# Patient Record
Sex: Female | Born: 1955 | Race: White | Hispanic: No | Marital: Single | Smoking: Never smoker
Health system: Southern US, Community
[De-identification: ages and names within clinical notes are randomized; demographics above are authoritative.]

## PROBLEM LIST (undated history)

## (undated) DIAGNOSIS — I10 Essential (primary) hypertension: Secondary | ICD-10-CM

## (undated) HISTORY — DX: Essential (primary) hypertension: I10

---

## 2005-02-23 HISTORY — PX: BREAST BIOPSY: SHX20

## 2015-10-19 NOTE — Progress Notes (Signed)
Cardiology Office Note   Date:  10/22/2015   ID:  Heather Russell, DOB 1955-06-13, MRN [id number]  Referring Doctor:  No PCP Per Patient   Cardiologist:   Wende Bushy, MD   Reason for consultation:  Chief Complaint  Patient presents with  . Establish Care    family hx of cardiac disease had stress test at [facility]      History of Present Illness: Heather Russell is a 60 y.o. female who presents for Establishing cardiology care due to family history of premature CAD.  Patient does not report any symptoms of chest pain, chest tightness, shortness of breath, palpitations, lightheadedness, syncope. No exertional symptoms. Growing up she has been very active in sports. She tried to take good care of herself. In the last 5 years or so, she had been unable to keep up with her physical activity due to stresses at work, and also the need to take care of her mother.  She has a strong family history of premature CAD: Both paternal grandparents had heart disease in their 26s and 75s. Father presented with CAD and open heart surgery at age 38. Brother had CAD/cardiac arrest at age 37.  ROS:  Please see the history of present illness. Aside from mentioned under HPI, all other systems are reviewed and negative.     Past Medical History:  Diagnosis Date  . Hypertension     History reviewed. No pertinent surgical history.   reports that she has never smoked. She has never used smokeless tobacco. She reports that she drinks about 3.6 - 5.4 oz of alcohol per week . She reports that she does not use drugs.   family history includes Heart attack (age of onset: 67) in her father; Heart disease in her brother and father; Hyperlipidemia in her father.   No outpatient prescriptions prior to visit.   No facility-administered medications prior to visit.      Allergies: Review of patient's allergies indicates no known allergies.    PHYSICAL EXAM: VS:  BP 120/80 (BP Location: Left Arm,  Cuff Size: Normal)   Pulse 72   Ht _0  (1.6 m)   Wt 148 lb 12.8 oz (67.5 kg)   SpO2 95%   BMI 26.36 kg/m  , Body mass index is 26.36 kg/m. Wt Readings from Last 3 Encounters:  10/22/15 148 lb 12.8 oz (67.5 kg)    GENERAL:  well developed, well nourished, not in acute distress HEENT: normocephalic, pink conjunctivae, anicteric sclerae, no xanthelasma, normal dentition, oropharynx clear NECK:  no neck vein engorgement, JVP normal, no hepatojugular reflux, carotid upstroke brisk and symmetric, no bruit, no thyromegaly, no lymphadenopathy LUNGS:  good respiratory effort, clear to auscultation bilaterally CV:  PMI not displaced, no thrills, no lifts, S1 and S2 within normal limits, no palpable S3 or S4, no murmurs, no rubs, no gallops ABD:  Soft, nontender, nondistended, normoactive bowel sounds, no abdominal aortic bruit, no hepatomegaly, no splenomegaly MS: nontender back, no kyphosis, no scoliosis, no joint deformities EXT:  2+ DP/PT pulses, no edema, no varicosities, no cyanosis, no clubbing SKIN: warm, nondiaphoretic, normal turgor, no ulcers NEUROPSYCH: alert, oriented to person, place, and time, sensory/motor grossly intact, normal mood, appropriate affect  Recent Labs: No results found for requested labs within last 8760 hours.   Lipid Panel No results found for: CHOL, TRIG, HDL, CHOLHDL, VLDL, LDLCALC, LDLDIRECT   Other studies Reviewed:  EKG:  The ekg from 10/22/2015 was personally reviewed by me and it revealed  sinus rhythm, 66 BPM. Low-voltage QRS.  Additional studies/ records that were reviewed personally reviewed by me today include:  Stress echo 04/19/2012, [facility]: 12.32 minutes. Maximum heart rate was 164. Target heart rate 140. 15.6 Mets. Normal stress test.   ASSESSMENT AND PLAN:  Hypertension BP is well controlled. Continue monitoring BP. Continue current medical therapy and lifestyle  Changes. Recommend aspirin 81 mg by mouth daily. EKG is borderline to  abnormal with low voltages - recommend echocardiogram.  Family history of premature CAD Recommend further risk stratification with some blood work: CBC, CMP, fasting lipid panel. If her lipids are controlled and there is no indication for statin therapy, she may consider doing calcium scoring for further risk stratification. Patient verbalized understanding and agreed with plan.  Current medicines are reviewed at length with the patient today.  The patient does not have concerns regarding medicines.  Labs/ tests ordered today include:  Orders Placed This Encounter  Procedures  . CBC with Differential/Platelet  . Comp Met (CMET)  . Lipid Profile  . EKG 12-Lead  . ECHOCARDIOGRAM COMPLETE    I had a lengthy and detailed discussion with the patient regarding diagnoses, prognosis, diagnostic options, treatment options , and Russell effects of medications.   I counseled the patient on importance of lifestyle modification including heart healthy diet, regular physical activity.  I spent at least 45 minutes with the patient today and more than 50% of the time was spent counseling the patient and coordinating care.     Disposition:   FU with undersigned after tests    Signed, Wende Bushy, MD  10/22/2015 9:39 AM    Heather Russell  This note was generated in part with voice recognition software and I apologize for any typographical errors that were not detected and corrected.

## 2015-10-22 ENCOUNTER — Encounter (INDEPENDENT_AMBULATORY_CARE_PROVIDER_SITE_OTHER): Payer: Self-pay

## 2015-10-22 ENCOUNTER — Encounter: Payer: Self-pay | Admitting: Cardiology

## 2015-10-22 ENCOUNTER — Ambulatory Visit (INDEPENDENT_AMBULATORY_CARE_PROVIDER_SITE_OTHER): Payer: BLUE CROSS/BLUE SHIELD | Admitting: Cardiology

## 2015-10-22 VITALS — BP 120/80 | HR 72 | Ht 63.0 in | Wt 148.8 lb

## 2015-10-22 DIAGNOSIS — Z8249 Family history of ischemic heart disease and other diseases of the circulatory system: Secondary | ICD-10-CM | POA: Diagnosis not present

## 2015-10-22 DIAGNOSIS — I10 Essential (primary) hypertension: Secondary | ICD-10-CM | POA: Diagnosis not present

## 2015-10-22 DIAGNOSIS — R9431 Abnormal electrocardiogram [ECG] [EKG]: Secondary | ICD-10-CM | POA: Diagnosis not present

## 2015-10-22 NOTE — Patient Instructions (Addendum)
Labwork: Your physician recommends that you return for lab work: CBC, CMP, and fasting lipid panel. Please make sure not to eat or drink the night prior to having these labs done.   Testing/Procedures: Your physician has requested that you have an echocardiogram. Echocardiography is a painless test that uses sound waves to create images of your heart. It provides your doctor with information about the size and shape of your heart and how well your heart's chambers and valves are working. This procedure takes approximately one hour. There are no restrictions for this procedure.    Follow-Up: Your physician recommends that you schedule a follow-up appointment as needed. We will call you with results and if needed schedule follow up at that time.  Try calling [facility] for a primary care provider. Their number is [phone] and address is as follows, [address], [place] [address]  It was a pleasure seeing you today here in the office. Please do not hesitate to give [place] a call back if you have any further questions. [phone]   Cellar RN, BSN    Echocardiogram An echocardiogram, or echocardiography, uses sound waves (ultrasound) to produce an image of your heart. The echocardiogram is simple, painless, obtained within a short period of time, and offers valuable information to your health care provider. The images from an echocardiogram can provide information such as:  Evidence of coronary artery disease (CAD).  Heart size.  Heart muscle function.  Heart valve function.  Aneurysm detection.  Evidence of a past heart attack.  Fluid buildup around the heart.  Heart muscle thickening.  Assess heart valve function. LET [facility] CARE PROVIDER KNOW ABOUT:  Any allergies you have.  All medicines you are taking, including vitamins, herbs, eye drops, creams, and over-the-counter medicines.  Previous problems you or members  of your family have had with the use of anesthetics.  Any blood disorders you have.  Previous surgeries you have had.  Medical conditions you have.  Possibility of pregnancy, if this applies. BEFORE THE PROCEDURE  No special preparation is needed. Eat and drink normally.  PROCEDURE   In order to produce an image of your heart, gel will be applied to your chest and a wand-like tool (transducer) will be moved over your chest. The gel will help transmit the sound waves from the transducer. The sound waves will harmlessly bounce off your heart to allow the heart images to be captured in real-time motion. These images will then be recorded.  You may need an IV to receive a medicine that improves the quality of the pictures. AFTER THE PROCEDURE You may return to your normal schedule including diet, activities, and medicines, unless your health care provider tells you otherwise.   This information is not intended to replace advice given to you by your health care provider. Make sure you discuss any questions you have with your health care provider.   Document Released: 02/07/2000 Document Revised: 03/02/2014 Document Reviewed: 10/17/2012 Elsevier Interactive Patient Education 2016 [employer].   Lipid Profile Test WHY AM I HAVING THIS TEST? The lipid profile test gives results that can help predict the likelihood of developing heart disease. The test is also used to monitor treatment for high cholesterol to see if you are reaching your goals. A lipid profile measures the following:  Total cholesterol. Cholesterol is a waxy fat in your blood. If your total cholesterol is elevated, this can increase your risk of coronary heart disease.  High-density lipoprotein (HDL). This is known as the good cholesterol. Having a high level of HDL is good. Your HDL level may be low if you smoke or do not get enough exercise.  Low-density lipoprotein (LDL). This is known as the bad cholesterol and is  responsible for the formation of plaque in the arteries. Having a low level of LDL is best.  Cholesterol to HDL ratio. This is calculated by dividing the total cholesterol by the HDL cholesterol. The ratio is used by health care providers for determining your risk of heart disease. A low ratio is best.  Triglycerides. These are a type of fat in the blood responsible for providing energy to your cells. Low levels are best. WHAT KIND OF SAMPLE IS TAKEN? A blood sample is required for this test. It is usually collected by inserting a needle into a vein. HOW DO I PREPARE FOR THE TEST?  Do not eat or drink anything after midnight on the night before the test or as directed by your health care provider. WHAT ARE THE REFERENCE RANGES? Reference ranges are considered healthy ranges established after testing a large group of healthy people. Reference ranges may vary among different people, labs, and hospitals. It is your responsibility to obtain your test results. Ask the lab or department performing the test when and how you will get your results. Reference ranges for the lipid profile test are as follows: Total Cholesterol  Adult or elderly: less than 200 mg/dL or less than 1.615.20 mmol/L (SI units).  Child: 120-200 mg/dL.  Infant: 70-175 mg/dL.  Newborns: 53-135 mg/dL. HDL  Female: greater than 45 mg/dL or greater than 0.960.75 mmol/L (SI units).  Female: greater than 55 mg/dL or greater than 0.450.91 mmol/L (SI units). HDL reference values based on risk of heart disease:  For low risk of heart disease:  Female: 60 mg/dL or 4.091.55 mmol/L.  Female: 70 mg/dL or 8.111.81 mmol/L.  For moderate risk of heart disease:  Female: 45 mg/dL or 9.141.17 mmol/L.  Female: 55 mg/dL or 7.821.42 mmol/L.  For high risk of heart disease:  Female: 25 mg/dL or 9.560.65 mmol/L.  Female: 35 mg/dL or 2.130.90 mmol/L. LDL  Adult: less than 130 mg/dL.  Children: less than 110 mg/dL. Cholesterol to HDL Ratio Reference values based on risk  for coronary heart disease:  Risk that is one half average:  Female: 3.4.  Female: 3.3.  Average risk:  Female: 5.0.  Female: 4.4.  Risk that is two times average (moderate risk):  Female: 10.0.  Female: 7.0.  Risk that is three times average (high risk):  Female: 24.0.  Female: 11.0. Triglycerides  Adult or elderly:  Female: 40-160 mg/dL or 0.86-5.780.45-1.81 mmol/L (SI units).  Female: 35-135 mg/dL or 4.69-6.290.40-1.52 mmol/L (SI units).  Children 630-58 years old:  Female: 30-86 mg/dL.  Female: 32-99 mg/dL.  Children 726-58 years old:  Female: 31-108 mg/dL.  Female: 35-114 mg/dL.  Children 5212-19177 years old:  Female: 36-138 mg/dL.  Female: 41-138 mg/dL.  Children 5816-58 years old:  Female: 40-163 mg/dL.  Female: 40-128 mg/dL. Triglycerides should be less than 400 mg/dL even when you are not fasting.  WHAT DO THE RESULTS MEAN?  Talk with your health care provider to discuss your results, treatment options, and if necessary, the need for more tests. Talk with your health care provider if you have any questions about your results.   This information is not intended to replace advice given to you by your health care provider. Make sure you  discuss any questions you have with your health care provider.   Document Released: 03/05/2004 Document Revised: 03/02/2014 Document Reviewed: 06/01/2013 Elsevier Interactive Patient Education [employer]

## 2015-10-29 ENCOUNTER — Other Ambulatory Visit: Payer: BLUE CROSS/BLUE SHIELD

## 2015-10-29 DIAGNOSIS — R9431 Abnormal electrocardiogram [ECG] [EKG]: Secondary | ICD-10-CM

## 2015-10-29 DIAGNOSIS — I10 Essential (primary) hypertension: Secondary | ICD-10-CM

## 2015-10-30 LAB — COMPREHENSIVE METABOLIC PANEL
ALT: 25 IU/L (ref 0–32)
AST: 17 IU/L (ref 0–40)
Albumin/Globulin Ratio: 2.3 — ABNORMAL HIGH (ref 1.2–2.2)
Albumin: 4.5 g/dL (ref 3.5–5.5)
Alkaline Phosphatase: 63 IU/L (ref 39–117)
BUN/Creatinine Ratio: 21 (ref 9–23)
BUN: 15 mg/dL (ref 6–24)
Bilirubin Total: 0.4 mg/dL (ref 0.0–1.2)
CO2: 23 mmol/L (ref 18–29)
Calcium: 9.3 mg/dL (ref 8.7–10.2)
Chloride: 99 mmol/L (ref 96–106)
Creatinine, Ser: 0.73 mg/dL (ref 0.57–1.00)
GFR calc Af Amer: 105 mL/min/{1.73_m2} (ref >59)
GFR calc non Af Amer: 91 mL/min/{1.73_m2} (ref >59)
Globulin, Total: 2 g/dL (ref 1.5–4.5)
Glucose: 95 mg/dL (ref 65–99)
Potassium: 4.2 mmol/L (ref 3.5–5.2)
Sodium: 140 mmol/L (ref 134–144)
Total Protein: 6.5 g/dL (ref 6.0–8.5)

## 2015-10-30 LAB — CBC WITH DIFFERENTIAL/PLATELET
Basophils Absolute: 0.1 10*3/uL (ref 0.0–0.2)
Basos: 1 %
EOS (ABSOLUTE): 0.2 10*3/uL (ref 0.0–0.4)
Eos: 3 %
Hematocrit: 43.6 % (ref 34.0–46.6)
Hemoglobin: 14.8 g/dL (ref 11.1–15.9)
Immature Grans (Abs): 0 10*3/uL (ref 0.0–0.1)
Immature Granulocytes: 0 %
Lymphocytes Absolute: 1.2 10*3/uL (ref 0.7–3.1)
Lymphs: 22 %
MCH: 30.3 pg (ref 26.6–33.0)
MCHC: 33.9 g/dL (ref 31.5–35.7)
MCV: 89 fL (ref 79–97)
Monocytes Absolute: 0.3 10*3/uL (ref 0.1–0.9)
Monocytes: 6 %
Neutrophils Absolute: 3.6 10*3/uL (ref 1.4–7.0)
Neutrophils: 68 %
Platelets: 257 10*3/uL (ref 150–379)
RBC: 4.88 x10E6/uL (ref 3.77–5.28)
RDW: 13.5 % (ref 12.3–15.4)
WBC: 5.3 10*3/uL (ref 3.4–10.8)

## 2015-10-30 LAB — LIPID PANEL
Chol/HDL Ratio: 3.4 ratio units (ref 0.0–4.4)
Cholesterol, Total: 215 mg/dL — ABNORMAL HIGH (ref 100–199)
HDL: 64 mg/dL (ref >39)
LDL Calculated: 114 mg/dL — ABNORMAL HIGH (ref 0–99)
Triglycerides: 184 mg/dL — ABNORMAL HIGH (ref 0–149)
VLDL Cholesterol Cal: 37 mg/dL (ref 5–40)

## 2015-11-07 ENCOUNTER — Telehealth: Payer: Self-pay | Admitting: *Deleted

## 2015-11-07 ENCOUNTER — Other Ambulatory Visit: Payer: Self-pay

## 2015-11-07 ENCOUNTER — Ambulatory Visit (INDEPENDENT_AMBULATORY_CARE_PROVIDER_SITE_OTHER): Payer: BLUE CROSS/BLUE SHIELD

## 2015-11-07 DIAGNOSIS — R9431 Abnormal electrocardiogram [ECG] [EKG]: Secondary | ICD-10-CM

## 2015-11-07 DIAGNOSIS — I10 Essential (primary) hypertension: Secondary | ICD-10-CM | POA: Diagnosis not present

## 2015-11-07 NOTE — Telephone Encounter (Signed)
Spoke with patient and she stated that she has not had symptoms with activity before and so she wanted to mention today while she was here. Let her know that Dr. Alvino ChapelIngal would like for her to come in and discuss these new symptoms with her. She verbalized agreement with plan of care and had no further questions at this time. Scheduled to come in 11/12/15 to see Dr. Alvino ChapelIngal.

## 2015-11-07 NOTE — Telephone Encounter (Signed)
-----   Message from Almond LintAileen Ingal, MD sent at 11/07/2015  9:09 AM EDT ----- Regarding: RE: Patient symptoms Based on office note she was not symptomatic at the time and therefore no ischemia eval was recommended. . This sounds new and she may need to come back in to discuss it with us. Thank you for that update. Pam, can we call her about this? Thank you! ----- Message ----- From: Edwena BlowGary T Joseph Sent: 11/07/2015   9:01 AM To: Almond LintAileen Ingal, MD, Bryna ColanderPamela S Roshan Roback, RN Subject: Patient symptoms                               FYI.Marland Kitchen.Marland Kitchen.patient's echo was just completed and it looked benign to me however, she reported that 2 weeks ago she had chest heaviness on a hiking trip.

## 2015-11-11 NOTE — Progress Notes (Signed)
Cardiology Office Note   Date:  11/12/2015   ID:  Heather Russell, DOB 1955/11/22, MRN [id number]  Referring Doctor:  No PCP Per Patient   Cardiologist:   Wende Bushy, MD   Reason for consultation:  Chief Complaint  Patient presents with  . other    F/u echo and labs no complaints today is feeling well. Meds reviewed verbally with pt.      History of Present Illness: Heather Russell is a 60 y.o. female who presents for Follow-up  She underwent echocardiogram recently. Her initial consultation with cardiology was in mainly for risk assessment for her strong family history of premature CAD.  She has a strong family history of premature CAD: Both paternal grandparents had heart disease in their 45s and 84s. Father presented with CAD and open heart surgery at age 54. Brother had CAD/cardiac arrest at age 69.  She went hiking recently. She had not done that for a long time. It was a 3-1/2 mile hike. She did develop new onset chest pain. This was described as a tightness or heaviness in the center of the chest, mild in intensity, nonradiating, lasting a few minutes, resolved with end of activity. No associated nausea, vomiting, diaphoresis.  ROS:  Please see the history of present illness. Aside from mentioned under HPI, all other systems are reviewed and negative.     Past Medical History:  Diagnosis Date  . Hypertension     History reviewed. No pertinent surgical history.   reports that she has never smoked. She has never used smokeless tobacco. She reports that she drinks about 3.6 - 5.4 oz of alcohol per week . She reports that she does not use drugs.   family history includes Heart attack (age of onset: 34) in her father; Heart disease in her brother and father; Hyperlipidemia in her father.   Outpatient Medications Prior to Visit  Medication Sig Dispense Refill  . Cholecalciferol (VITAMIN D3) 5000 units CAPS Take 1 capsule by mouth daily.    . Multiple  Vitamin (MULTI-VITAMINS) TABS Take 1 tablet by mouth daily.    . irbesartan-hydrochlorothiazide (AVALIDE) 150-12.5 MG tablet Take 1 tablet by mouth daily.     No facility-administered medications prior to visit.      Allergies: Other    PHYSICAL EXAM: VS:  BP 120/90 (BP Location: Left Arm, Patient Position: Sitting, Cuff Size: Normal)   Pulse 65   Ht '5\' 3"'$  (1.6 m)   Wt 148 lb (67.1 kg)   BMI 26.22 kg/m  , Body mass index is 26.22 kg/m. Wt Readings from Last 3 Encounters:  11/12/15 148 lb (67.1 kg)  10/22/15 148 lb 12.8 oz (67.5 kg)    GENERAL:  well developed, well nourished, not in acute distress HEENT: normocephalic, pink conjunctivae, anicteric sclerae, no xanthelasma, normal dentition, oropharynx clear NECK:  no neck vein engorgement, JVP normal, no hepatojugular reflux, carotid upstroke brisk and symmetric, no bruit, no thyromegaly, no lymphadenopathy LUNGS:  good respiratory effort, clear to auscultation bilaterally CV:  PMI not displaced, no thrills, no lifts, S1 and S2 within normal limits, no palpable S3 or S4, no murmurs, no rubs, no gallops ABD:  Soft, nontender, nondistended, normoactive bowel sounds, no abdominal aortic bruit, no hepatomegaly, no splenomegaly MS: nontender back, no kyphosis, no scoliosis, no joint deformities EXT:  2+ DP/PT pulses, no edema, no varicosities, no cyanosis, no clubbing SKIN: warm, nondiaphoretic, normal turgor, no ulcers NEUROPSYCH: alert, oriented to person, place, and time, sensory/motor grossly intact,  normal mood, appropriate affect  Recent Labs: 10/29/2015: ALT 25; BUN 15; Creatinine, Ser 0.73; Platelets 257; Potassium 4.2; Sodium 140   Lipid Panel    Component Value Date/Time   CHOL 215 (H) 10/29/2015 0811   TRIG 184 (H) 10/29/2015 0811   HDL 64 10/29/2015 0811   CHOLHDL 3.4 10/29/2015 0811   LDLCALC 114 (H) 10/29/2015 0811     Other studies Reviewed:  EKG:  The ekg from 10/22/2015 was personally reviewed by me and it  revealed sinus rhythm, 66 BPM. Low-voltage QRS.  Additional studies/ records that were reviewed personally reviewed by me today include:  Stress echo 04/19/2012, [facility]: 12.32 minutes. Maximum heart rate was 164. Target heart rate 140. 15.6 Mets. Normal stress test.  Echo 11/07/2015: Left ventricle: The cavity size was normal. Wall thickness was   normal. Systolic function was normal. The estimated ejection   fraction was in the range of 55% to 60%. Wall motion was normal;   there were no regional wall motion abnormalities. Doppler   parameters are consistent with abnormal left ventricular   relaxation (grade 1 diastolic dysfunction).   ASSESSMENT AND PLAN:  Chest pain With her strong family history of CAD, recommend further evaluation with a nuclear exercise stress test. Recommend a clear imaging, since patient had stress echo within the last 5 years. Imaging is recommended to increase the sensitivity of testing.  Hypertension BP is well controlled. Continue monitoring BP. Continue current medical therapy and lifestyle  Changes. Recommend aspirin 81 mg by mouth daily. Echocardiogram showed normal LV ejection fraction.  Family history of premature CAD LDL mildly elevated. Discussed options of treatment. Patient would like to initiate lifestyle changes including dietary modification and increasing physical activity once cardiac workup is completed. Recommend repeat blood work lipid panel and CMP in 3 months time. Patient can follow-up with her PCP. If the stress test is negative,, she can still consider doing calcium scoring for further risk stratification. Patient verbalized understanding and agreed with plan.  Current medicines are reviewed at length with the patient today.  The patient does not have concerns regarding medicines.  Labs/ tests ordered today include:  Orders Placed This Encounter  Procedures  . NM Myocar Multi W/Spect W/Wall Motion / EF  . Comp Met (CMET)  . Lipid  Profile  . EKG 12-Lead    I had a lengthy and detailed discussion with the patient regarding diagnoses, prognosis, diagnostic options, treatment options , and side effects of medications.   I counseled the patient on importance of lifestyle modification including heart healthy diet, regular physical activity.Once cardiac workup is completed  Disposition:   FU with undersigned after tests    Signed, Wende Bushy, MD  11/12/2015 10:30 AM    [place]  This note was generated in part with voice recognition software and I apologize for any typographical errors that were not detected and corrected.

## 2015-11-12 ENCOUNTER — Ambulatory Visit (INDEPENDENT_AMBULATORY_CARE_PROVIDER_SITE_OTHER): Payer: BLUE CROSS/BLUE SHIELD | Admitting: Cardiology

## 2015-11-12 ENCOUNTER — Encounter: Payer: Self-pay | Admitting: Cardiology

## 2015-11-12 VITALS — BP 120/90 | HR 65 | Ht 63.0 in | Wt 148.0 lb

## 2015-11-12 DIAGNOSIS — Z8249 Family history of ischemic heart disease and other diseases of the circulatory system: Secondary | ICD-10-CM | POA: Diagnosis not present

## 2015-11-12 DIAGNOSIS — R079 Chest pain, unspecified: Secondary | ICD-10-CM | POA: Diagnosis not present

## 2015-11-12 DIAGNOSIS — I10 Essential (primary) hypertension: Secondary | ICD-10-CM

## 2015-11-12 MED ORDER — IRBESARTAN-HYDROCHLOROTHIAZIDE 150-12.5 MG PO TABS: 1.0000 | ORAL_TABLET | Freq: Every day | ORAL | 6 refills | Status: DC

## 2015-11-12 NOTE — Patient Instructions (Addendum)
Medication Instructions:  Your physician has recommended you make the following change in your medication:  1. Refill sent in for Avalide  Labwork: Your physician recommends that you return for lab work in: 3 months for fasting lipid panel and CMP. Make sure to not eat or drink after midnight prior to having these tests done.    Testing/Procedures: [place]  Your caregiver has ordered a Stress Test with nuclear imaging. The purpose of this test is to evaluate the blood supply to your heart muscle. This procedure is referred to as a "Non-Invasive Stress Test." This is because other than having an IV started in your vein, nothing is inserted or "invades" your body. Cardiac stress tests are done to find areas of poor blood flow to the heart by determining the extent of coronary artery disease (CAD).    Please note: these test may take anywhere between 2-4 hours to complete  PLEASE REPORT TO [facility] AT THE FIRST DESK WILL DIRECT YOU WHERE TO GO  Date of Procedure:_Tuesday November 26, 2015 at 07:30AM_  Arrival Time for Procedure:__Arrive at 07:15AM to register___   PLEASE NOTIFY THE OFFICE AT LEAST 24 HOURS IN ADVANCE IF YOU ARE UNABLE TO [place].  [phone] AND  PLEASE NOTIFY NUCLEAR MEDICINE AT [facility] AT LEAST 24 HOURS IN ADVANCE IF YOU ARE UNABLE TO KEEP YOUR APPOINTMENT. [phone]  How to prepare for your Myoview test:  1. Do not eat or drink after midnight 2. No caffeine for 24 hours prior to test 3. No smoking 24 hours prior to test. 4. Your medication may be taken with water.  If your doctor stopped a medication because of this test, do not take that medication. 5. Ladies, please do not wear dresses.  Skirts or pants are appropriate. Please wear a short sleeve shirt. 6. No perfume, cologne or lotion. 7. Wear comfortable walking shoes. No heels!  Follow-Up: Your physician recommends that you schedule a follow-up  appointment as needed. We will call you with results and if needed schedule follow up at that time.  It was a pleasure seeing you today here in the office. Please do not hesitate to give [place] a call back if you have any further questions. [place], BSN      Please establish care with a primary care physician.      Cardiac Nuclear Scanning A cardiac nuclear scan is used to check your heart for problems, such as the following:  A portion of the heart is not getting enough blood.  Part of the heart muscle has died, which happens with a heart attack.  The heart wall is not working normally.  In this test, a radioactive dye (tracer) is injected into your bloodstream. After the tracer has traveled to your heart, a scanning device is used to measure how much of the tracer is absorbed by or distributed to various areas of your heart. LET [facility] CARE PROVIDER KNOW ABOUT:  Any allergies you have.  All medicines you are taking, including vitamins, herbs, eye drops, creams, and over-the-counter medicines.  Previous problems you or members of your family have had with the use of anesthetics.  Any blood disorders you have.  Previous surgeries you have had.  Medical conditions you have.  RISKS AND COMPLICATIONS Generally, this is a safe procedure. However, as with any procedure, problems can occur. Possible problems include:   Serious chest pain.  Rapid heartbeat.  Sensation of  warmth in your chest. This usually passes quickly. BEFORE THE PROCEDURE Ask your health care provider about changing or stopping your regular medicines. PROCEDURE This procedure is usually done at a hospital and takes 2-4 hours.  An IV tube is inserted into one of your veins.  Your health care provider will inject a small amount of radioactive tracer through the tube.  You will then wait for 20-40 minutes while the tracer travels through your bloodstream.  You will lie down on an  exam table so images of your heart can be taken. Images will be taken for about 15-20 minutes.  You will exercise on a treadmill or stationary bike. While you exercise, your heart activity will be monitored with an electrocardiogram (ECG), and your blood pressure will be checked.  If you are unable to exercise, you may be given a medicine to make your heart beat faster.  When blood flow to your heart has peaked, tracer will again be injected through the IV tube.  After 20-40 minutes, you will get back on the exam table and have more images taken of your heart.  When the procedure is over, your IV tube will be removed. AFTER THE PROCEDURE  You will likely be able to leave shortly after the test. Unless your health care provider tells you otherwise, you may return to your normal schedule, including diet, activities, and medicines.  Make sure you find out how and when you will get your test results.   This information is not intended to replace advice given to you by your health care provider. Make sure you discuss any questions you have with your health care provider.   Document Released: 03/06/2004 Document Revised: 02/14/2013 Document Reviewed: 01/18/2013 Elsevier Interactive Patient Education 2016 [place] Profile Test WHY AM I HAVING THIS TEST? The lipid profile test gives results that can help predict the likelihood of developing heart disease. The test is also used to monitor treatment for high cholesterol to see if you are reaching your goals. A lipid profile measures the following:  Total cholesterol. Cholesterol is a waxy fat in your blood. If your total cholesterol is elevated, this can increase your risk of coronary heart disease.  High-density lipoprotein (HDL). This is known as the good cholesterol. Having a high level of HDL is good. Your HDL level may be low if you smoke or do not get enough exercise.  Low-density lipoprotein (LDL). This is known as the  bad cholesterol and is responsible for the formation of plaque in the arteries. Having a low level of LDL is best.  Cholesterol to HDL ratio. This is calculated by dividing the total cholesterol by the HDL cholesterol. The ratio is used by health care providers for determining your risk of heart disease. A low ratio is best.  Triglycerides. These are a type of fat in the blood responsible for providing energy to your cells. Low levels are best. WHAT KIND OF SAMPLE IS TAKEN? A blood sample is required for this test. It is usually collected by inserting a needle into a vein. HOW DO I PREPARE FOR THE TEST?  Do not eat or drink anything after midnight on the night before the test or as directed by your health care provider. WHAT ARE THE REFERENCE RANGES? Reference ranges are considered healthy ranges established after testing a large group of healthy people. Reference ranges may vary among different people, labs, and hospitals. It is your responsibility to obtain your test  results. Ask the lab or department performing the test when and how you will get your results. Reference ranges for the lipid profile test are as follows: Total Cholesterol  Adult or elderly: less than 200 mg/dL or less than 5.20 mmol/L (SI units).  Child: 120-200 mg/dL.  Infant: 70-175 mg/dL.  Newborns: 53-135 mg/dL. HDL  Female: greater than 45 mg/dL or greater than 0.75 mmol/L (SI units).  Female: greater than 55 mg/dL or greater than 0.91 mmol/L (SI units). HDL reference values based on risk of heart disease:  For low risk of heart disease:  Female: 60 mg/dL or 1.55 mmol/L.  Female: 70 mg/dL or 1.81 mmol/L.  For moderate risk of heart disease:  Female: 45 mg/dL or 1.17 mmol/L.  Female: 55 mg/dL or 1.42 mmol/L.  For high risk of heart disease:  Female: 25 mg/dL or 0.65 mmol/L.  Female: 35 mg/dL or 0.90 mmol/L. LDL  Adult: less than 130 mg/dL.  Children: less than 110 mg/dL. Cholesterol to HDL  Ratio Reference values based on risk for coronary heart disease:  Risk that is one half average:  Female: 3.4.  Female: 3.3.  Average risk:  Female: 5.0.  Female: 4.4.  Risk that is two times average (moderate risk):  Female: 10.0.  Female: 7.0.  Risk that is three times average (high risk):  Female: 24.0.  Female: 11.0. Triglycerides  Adult or elderly:  Female: 40-160 mg/dL or 0.45-1.81 mmol/L (SI units).  Female: 35-135 mg/dL or 0.40-1.52 mmol/L (SI units).  Children 56-54 years old:  Female: 30-86 mg/dL.  Female: 32-99 mg/dL.  Children 62-28 years old:  Female: 31-108 mg/dL.  Female: 35-114 mg/dL.  Children 24-76 years old:  Female: 36-138 mg/dL.  Female: 41-138 mg/dL.  Children 52-17 years old:  Female: 40-163 mg/dL.  Female: 40-128 mg/dL. Triglycerides should be less than 400 mg/dL even when you are not fasting.  WHAT DO THE RESULTS MEAN?  Talk with your health care provider to discuss your results, treatment options, and if necessary, the need for more tests. Talk with your health care provider if you have any questions about your results.   This information is not intended to replace advice given to you by your health care provider. Make sure you discuss any questions you have with your health care provider.   Document Released: 03/05/2004 Document Revised: 03/02/2014 Document Reviewed: 06/01/2013 Elsevier Interactive Patient Education 2016 [place].    Comprehensive Metabolic Panel The comprehensive metabolic panel (CMP) measures levels of the following substances in your blood:  Glucose. Glucose is a simple sugar that serves as the main source of energy for your body.  Creatinine. Creatinine is a waste product of normal muscle activity. It is excreted from the body by the kidneys.  Blood urea nitrogen (BUN). Urea nitrogen is a waste product of protein breakdown. It is produced when excess protein in your body is broken down and used for energy. It  is excreted by the kidneys.  Electrolytes. Electrolytes are negatively or positively charged particles that are dissolved in the water of different body compartments. This includes the serum portion of blood, water inside cells, and water outside cells. Concentrations of electrolytes vary among the different fluid compartments. Electrolytes are tightly regulated to maintain a salt-water and acid-base balance in the body. The electrolytes include:  Potassium.  Sodium.  Chloride.  Calcium.  Bicarbonate.  Alkaline phosphatase (ALP). This is a protein found in all tissues of your body. The bones, liver, and bile ducts have the highest  amounts.  Alanine aminotransferase (ALT). ALT is an enzyme found throughout the body. It is present in the highest concentrations in the tissues of the liver. If your liver is injured, there will also be ALT in your bloodstream.  Aspartate aminotransferase (AST). This is another enzyme found mostly in your liver. There is also a high concentration in your muscle cells and heart. Testing for AST is helpful to check for liver damage.  Bilirubin. As the liver breaks down red blood cells, it produces a waste product called bilirubin. A high level of bilirubin can indicate certain health problems.  Albumin. This is a protein that is a major component of the liquid part of blood (serum). It is made by the liver and can be measured in the bloodstream.  Total protein. This is a measurement of the amount of the two types of protein found in blood serum. The two proteins are albumin and globulins. Globulins are part of your immune system. A comprehensive metabolic panel requires a blood sample taken from a vein in your arm or hand. PREPARATION FOR TEST Your health care provider may ask you not to eat or drink anything for 6-8 hours before your blood sample is taken.  RESULTS  It is your responsibility to obtain your test results. Ask the lab or department performing the  test when and how you will get your results. Contact your health care provider to discuss any questions you have about your results. RANGE OF NORMAL VALUES Ranges for normal values may vary among different labs and hospitals. You should always check with your health care provider after having lab work or other tests done to discuss whether your values are considered within normal limits. The following are normal ranges for each part of a CMP: Glucose  Cord: 45-96 mg/dL or 2.5-5.3 mmol/L (SI units).  Premature infant: 20-60 mg/dL or 1.1-3.3 mmol/L.  Neonate: 30-60 mg/dL or 1.7-3.3 mmol/L.  Infant: 40-90 mg/dL or 2.2-5.0 mmol/L.  Child under 38 years old: 60-100 mg/dL or 3.3-5.5 mmol/L.  Adult or child over 58 years old:  Fasting: 70-110 mg/dL or less than 6.1 mmol/L.  Random (nonfasting or casual): less than or equal to 200 mg/dL or less than 11.1 mmol/L.  Elderly: increase in normal range after age 13 years. Creatinine  Child under 32 years old: 0.1-0.4 mg/dL.  Child 38-47 years old: 0.2-0.5 mg/dL.  Child 44-52 years old: 0.3-0.6 mg/dL.  Child or adolescent 54-35 years old: 0.4-1.0 mg/dL.  Adult 64-54 years old:  Female: 0.5-1.0 mg/dL.  Female: 0.6-1.2 mg/dL.  Adult 55-73 years old:  Female: 0.5-1.1 mg/dL.  Female: 0.6-1.3 mg/dL.  Adult 25 years old and above:  Female: 0.5-1.2 mg/dL.  Female: 0.7-1.3 mg/dL. BUN  Cord: 21-40 mg/dL.  Newborn: 3-12 mg/dL.  Infant: 5-18 mg/dL.  Child: 5-18 mg/dL.  Adult: 10-20 mg/dL or 3.6-7.1 mmol/L (SI units).  Elderly: may be slightly higher than adult. Potassium  Newborn: 3.9-5.9 mEq/L.  Infant: 4.1-5.3 mEq/L.  Child: 3.4-4.7 mEq/L.  Adult or elderly: 3.5-5.0 mEq/L or 3.5-5.0 mmol/L (SI units). Sodium  Newborn: 134-144 mEq/L.  Infant: 134-150 mEq/L.  Child: 136-145 mEq/L.  Adult or elderly: 136-145 mEq/L or 136-145 mmol/L (SI units). Chloride  Premature infant: 95-110 mEq/L.  Newborn: 96-106 mEq/L.  Child:  90-110 mEq/L.  Adult or elderly: 98-106 mEq/L or 98-106 mmol/L (SI units). Calcium  Total calcium:  Newborn under 45 days old: 7.6-10.4 mg/dL or 1.9-2.60 mmol/L.  Umbilical: 3.2-67.1 mg/dL or 2.25-2.88 mmol/L.  10 days to 58 years  old: 9.0-10.6 mg/dL or 2.3-2.65 mmol/L.  Child: 8.8-10.8 mg/dL or 2.2-2.7 mmol/L.  Adult: 9.0-10.5 mg/dL or 2.25-2.62 mmol/L.  Ionized calcium:  Newborn: 4.20-5.58 mg/dL or 1.05-1.37 mmol/L.  2 months to 58 years old: 4.80-5.52 mg/dL or 1.20-1.38 mmol/L.  Adult: 4.5-5.6 mg/dL or 1.05-1.30 mmol/L. Bicarbonate  Newborn: 13-22 mEq/L.  Infant: 20-28 mEq/L.  Child: 20-28 mEq/L.  Adult or elderly: 23-30 mEq/L or 23-30 mmol/L (SI units). ALP  Child under 79 years old: 85-235 units/L.  58-35 years old: 65-210 units/L.  1-47 years old: 60-300 units/L.  38-74 years old: 30-200 units/L.  Adult: 30-120 units/L or 0.5-2.0 microkatal/L (SI units).  Elderly: slightly higher than adult. ALT  Infant: may be twice as high as adult values.  Child or adult: 4-36 international units/L at 98.67F (37C) or 4-36 units/L (SI units).  Elderly: may be slightly higher than adult values. AST  Newborn 52-71 days old: 35-140 units/L.  Child under 63 years old: 15-60 units/L.  68-4 years old: 15-50 units/L.  14-9 years old: 10-50 units/L.  39-65 years old: 10-40 units/L.  Adult: 0-35 units/L or 0-0.58 microkatal/L (SI units).  Elderly: slightly higher than adults. Bilirubin  Total bilirubin for newborn: 1.0-12.0 mg/dL or 17.1-205 micromoles/L (SI units).  Adult, elderly, or child:  Total bilirubin: 0.3-1.0 mg/dL or 5.1-17 micromoles/L.  Indirect bilirubin: 0.2-0.8 mg/dL or 3.4-12.0 micromoles/L.  Direct bilirubin: 0.1-0.3 mg/dL or 1.7-5.1 micromoles/L. Albumin  Premature infant: 3.0-4.2 g/dL.  Newborn: 3.5-5.4 g/dL.  Infant: 4.4-5.4 g/dL.  Child: 4.0-5.9 g/dL.  Adult or elderly: 3.5-5.0 g/dL or 35-50 g/L (SI units). Total  protein  Premature infant: 4.2-7.6 g/dL.  Newborn: 4.6-7.4 g/dL.  Infant: 6.0-6.7 g/dL.  Child: 6.2-8.0 g/dL.  Adult or elderly: 6.4-8.3 g/dL or 64-83 g/L (SI units). MEANING OF RESULTS OUTSIDE OF NORMAL VALUE RANGES Diet and levels of activity can have an effect on your test results. Sometimes they can be the cause of values that are outside of normal limits. However, sometimes values outside normal limits can indicate a medical disorder: Glucose Abnormally high glucose levels (hyperglycemia) are usually associated with prediabetes mellitus and diabetes mellitus. They can also occur with severe stress on the body. This stress can come from surgery or events such as stroke or trauma. Overactive thyroid gland and pancreatitis or pancreatic cancer can also cause abnormally high glucose levels. Abnormally low glucose levels (hypoglycemia) can occur with underactive thyroid gland and rare insulin-secreting tumors (insulinoma). Creatinine Abnormally high creatinine levels are most commonly seen in kidney failure. They can also be seen with overactive thyroid (hyperthyroidism), conditions related to overgrowth of the body (acromegaly or gigantism), abnormal breakdown of muscle tissue (rhabdomyolysis), and early muscular dystrophy. Abnormally low creatinine levels can indicate low muscle mass associated with malnutrition or late-stage muscular dystrophy. BUN Abnormally high BUN levels, especially greater than 50 mg/dL, generally mean that your kidneys are not functioning normally. Abnormally low BUN levels can be seen with malnutrition and liver failure. Potassium Abnormally high potassium levels (hyperkalemia) are most often seen with kidney disease, massive destruction of red blood cells (hemolysis), and adrenal gland failure (Addison disease). Abnormally low potassium levels (hypokalemia) are seen with excessive levels of the hormone aldosterone (hyperaldosteronism). Sodium Abnormally high sodium  levels (hypernatremia) can be seen with dehydration, excessive thirst, and urination due to abnormally low levels of antidiuretic hormone (diabetes insipidus). They can also be seen with hyperaldosteronism and excessive levels of cortisol in the body (Cushing syndrome). Abnormally low levels of sodium (hyponatremia) can be seen with congestive heart  failure, cirrhosis of the liver, kidney failure, and the syndrome of inappropriate antidiuretic hormone (SIADH). Chloride Abnormally high levels of chloride (hyperchloremia) can be seen with acute kidney failure, diabetes insipidus, prolonged diarrhea, and poisoning with aspirin or bromide. Abnormally low levels of chloride (hypochloremia) can be seen with prolonged vomiting, acute adrenal gland failure (addisonian crisis), hyperaldosteronism, and SIADH. Calcium Abnormally high levels of calcium (hypercalcemia) can occur with excessive activity of the parathyroid glands (hyperparathyroidism), certain cancers, and a type of inflammation seen in sarcoidosis and tuberculosis. Abnormally low levels of calcium (hypocalcemia) can be seen with underactive parathyroid glands (hypoparathyroidism), vitamin D deficiency, and acute pancreatitis. Bicarbonate Abnormally high bicarbonate levels are seen after prolonged vomiting and diuretic therapy, which lead to a decrease in the amount of acid in the body (metabolic alkalosis). They can also be seen in conditions that increase the amount of bicarbonate in the body. These conditions include hyperaldosteronism and rare hereditary disorders that interfere with how your kidneys handle electrolytes, such as Bartter syndrome. Abnormally low bicarbonate levels are seen with conditions that cause your body to produce too much acid (metabolic acidosis). These conditions include uncontrolled diabetes mellitus and poisoning with aspirin, methanol, or antifreeze (ethylene glycol). ALP An abnormally high level of ALP can be a sign of  certain cancers or tumors, liver disease, hepatitis, sarcoidosis, rickets, a blocked bile duct, or bone problems such as a fracture. An abnormally low level of ALP can be caused by malnutrition, hypophosphatasia, or Wilson disease. ALT An abnormally high level of ALT can indicate mononucleosis, pancreatitis, or liver problems. The liver problems include hepatitis, cirrhosis, and liver cancer. AST An abnormally high level of AST can indicate some of the same liver problems as a high level of ALT. It is also related to conditions such as mononucleosis, pancreatitis, and muscle trauma. Bilirubin An abnormally high level of bilirubin can result from liver problems such as cirrhosis, liver disease, and hepatitis. It can also result from problems with the bile ducts, pancreas, or gallbladder. Albumin An abnormally high level of albumin might be a sign of dehydration or of eating a high-protein diet. You can have a low level of albumin if you eat a low-protein diet or have had weight-loss surgery. An abnormally low level of albumin can also be a sign of a more serious health issue, including liver disease, kidney disease, or Crohn disease. Total protein An abnormally high total protein level often indicates an infection (such as hepatitis B or C or HIV), multiple myeloma, or Waldenstrom disease. An abnormally low level of total protein is seen in such conditions as malnutrition, severe burns, heavy bleeding, and liver disease.   This information is not intended to replace advice given to you by your health care provider. Make sure you discuss any questions you have with your health care provider.   Document Released: 03/04/2004 Document Revised: 03/02/2014 Document Reviewed: 06/07/2013 Elsevier Interactive Patient Education [employer].

## 2015-11-26 ENCOUNTER — Encounter
Admission: RE | Admit: 2015-11-26 | Discharge: 2015-11-26 | Disposition: A | Discharge status: Home / Self Care | Payer: BLUE CROSS/BLUE SHIELD | Source: Ambulatory Visit | Attending: Cardiology | Admitting: Cardiology

## 2015-11-26 DIAGNOSIS — R0789 Other chest pain: Secondary | ICD-10-CM

## 2015-11-26 DIAGNOSIS — R079 Chest pain, unspecified: Secondary | ICD-10-CM | POA: Insufficient documentation

## 2015-11-26 MED ORDER — TECHNETIUM TC 99M TETROFOSMIN IV KIT
13.8200 | PACK | Freq: Once | INTRAVENOUS | Status: AC | PRN
  Administered 2015-11-26: 13.82 via INTRAVENOUS

## 2015-11-26 MED ORDER — TECHNETIUM TC 99M TETROFOSMIN IV KIT
32.2600 | PACK | Freq: Once | INTRAVENOUS | Status: AC | PRN
  Administered 2015-11-26: 32.26 via INTRAVENOUS

## 2015-11-27 LAB — NM MYOCAR MULTI W/SPECT W/WALL MOTION / EF
Estimated workload: 10.1 METS
Exercise duration (min): 9 min
Exercise duration (sec): 0 s
LV dias vol: 39 mL (ref 46–106)
LV sys vol: 16 mL
Peak BP: 169 mmHg
Peak HR: 137 {beats}/min
Percent HR: 85 %
Percent of predicted max HR: 84 %
Rest HR: 55 {beats}/min
SDS: 1
SRS: 7
SSS: 2
Stage 1 Grade: 0 %
Stage 1 HR: 73 {beats}/min
Stage 1 Speed: 0 mph
Stage 2 Grade: 0 %
Stage 2 HR: 73 {beats}/min
Stage 2 Speed: 0 mph
Stage 3 DBP: 86 mmHg
Stage 3 Grade: 10 %
Stage 3 HR: 106 {beats}/min
Stage 3 SBP: 148 mmHg
Stage 3 Speed: 1.7 mph
Stage 4 DBP: 92 mmHg
Stage 4 Grade: 12 %
Stage 4 HR: 131 {beats}/min
Stage 4 SBP: 141 mmHg
Stage 4 Speed: 2.5 mph
Stage 5 DBP: 107 mmHg
Stage 5 Grade: 14 %
Stage 5 HR: 137 {beats}/min
Stage 5 SBP: 169 mmHg
Stage 5 Speed: 3.4 mph
Stage 6 Grade: 0 %
Stage 6 HR: 109 {beats}/min
Stage 6 Speed: 0 mph
Stage 7 DBP: 77 mmHg
Stage 7 Grade: 0 %
Stage 7 HR: 77 {beats}/min
Stage 7 SBP: 125 mmHg
Stage 7 Speed: 0 mph
TID: 0.83

## 2016-02-11 ENCOUNTER — Other Ambulatory Visit: Payer: BLUE CROSS/BLUE SHIELD

## 2016-02-25 ENCOUNTER — Ambulatory Visit (INDEPENDENT_AMBULATORY_CARE_PROVIDER_SITE_OTHER): Payer: BLUE CROSS/BLUE SHIELD | Admitting: Family Medicine

## 2016-02-25 DIAGNOSIS — Z0289 Encounter for other administrative examinations: Secondary | ICD-10-CM

## 2016-04-03 LAB — HM PAP SMEAR: HM Pap smear: NEGATIVE

## 2016-04-06 ENCOUNTER — Other Ambulatory Visit: Payer: Self-pay | Admitting: Advanced Practice Midwife

## 2016-04-06 DIAGNOSIS — N6313 Unspecified lump in the right breast, lower outer quadrant: Secondary | ICD-10-CM

## 2016-04-14 ENCOUNTER — Other Ambulatory Visit: Payer: Self-pay | Admitting: *Deleted

## 2016-04-14 ENCOUNTER — Inpatient Hospital Stay
Admission: RE | Admit: 2016-04-14 | Discharge: 2016-04-14 | Disposition: A | Discharge status: Home / Self Care | Payer: Self-pay | Source: Ambulatory Visit | Attending: *Deleted | Admitting: *Deleted

## 2016-04-14 DIAGNOSIS — Z9289 Personal history of other medical treatment: Secondary | ICD-10-CM

## 2016-04-22 ENCOUNTER — Ambulatory Visit
Admission: RE | Admit: 2016-04-22 | Discharge: 2016-04-22 | Disposition: A | Discharge status: Home / Self Care | Payer: BLUE CROSS/BLUE SHIELD | Source: Ambulatory Visit | Attending: Advanced Practice Midwife | Admitting: Advanced Practice Midwife

## 2016-04-22 ENCOUNTER — Encounter: Payer: Self-pay | Admitting: Radiology

## 2016-04-22 DIAGNOSIS — N6313 Unspecified lump in the right breast, lower outer quadrant: Secondary | ICD-10-CM

## 2016-04-22 DIAGNOSIS — N6311 Unspecified lump in the right breast, upper outer quadrant: Secondary | ICD-10-CM | POA: Insufficient documentation

## 2016-04-23 ENCOUNTER — Other Ambulatory Visit: Payer: Self-pay | Admitting: *Deleted

## 2016-04-23 ENCOUNTER — Inpatient Hospital Stay
Admission: RE | Admit: 2016-04-23 | Discharge: 2016-04-23 | Disposition: A | Discharge status: Home / Self Care | Payer: Self-pay | Source: Ambulatory Visit | Attending: *Deleted | Admitting: *Deleted

## 2016-04-23 DIAGNOSIS — Z9289 Personal history of other medical treatment: Secondary | ICD-10-CM

## 2016-04-29 ENCOUNTER — Ambulatory Visit: Payer: BLUE CROSS/BLUE SHIELD

## 2016-04-29 ENCOUNTER — Other Ambulatory Visit: Payer: BLUE CROSS/BLUE SHIELD

## 2016-09-15 ENCOUNTER — Other Ambulatory Visit: Payer: Self-pay

## 2016-09-15 ENCOUNTER — Telehealth: Payer: Self-pay

## 2016-09-15 NOTE — Telephone Encounter (Signed)
Left patient a message on voicemail advising her to call back to schedule new patient appointment if still needed okay per Adriana.

## 2016-09-15 NOTE — Telephone Encounter (Signed)
Former [employer] patient and was last seen 10/2015 Please review for refill, Thanks!

## 2016-09-15 NOTE — Telephone Encounter (Signed)
Patient is requesting to Establish Care. Current medical problems are HTN. She is taking Avalide which she would need refilled. [facility] [place] [facility]. Insurance is [employer] CB# [phone].

## 2016-09-16 ENCOUNTER — Encounter: Payer: Self-pay | Admitting: Physician Assistant

## 2016-09-16 ENCOUNTER — Ambulatory Visit (INDEPENDENT_AMBULATORY_CARE_PROVIDER_SITE_OTHER): Payer: BLUE CROSS/BLUE SHIELD | Admitting: Physician Assistant

## 2016-09-16 VITALS — BP 132/76 | HR 64 | Temp 98.4°F | Resp 16 | Ht 63.0 in | Wt 146.0 lb

## 2016-09-16 DIAGNOSIS — Z1211 Encounter for screening for malignant neoplasm of colon: Secondary | ICD-10-CM | POA: Diagnosis not present

## 2016-09-16 DIAGNOSIS — Z7689 Persons encountering health services in other specified circumstances: Secondary | ICD-10-CM | POA: Diagnosis not present

## 2016-09-16 DIAGNOSIS — E7800 Pure hypercholesterolemia, unspecified: Secondary | ICD-10-CM | POA: Diagnosis not present

## 2016-09-16 DIAGNOSIS — I10 Essential (primary) hypertension: Secondary | ICD-10-CM | POA: Diagnosis not present

## 2016-09-16 MED ORDER — IRBESARTAN-HYDROCHLOROTHIAZIDE 150-12.5 MG PO TABS: 1.0000 | ORAL_TABLET | Freq: Every day | ORAL | 1 refills | Status: DC

## 2016-09-16 NOTE — Patient Instructions (Signed)

## 2016-09-16 NOTE — Progress Notes (Signed)
Patient: Heather BatmanRosemary Buttery Female    DOB: [birth year]   61 y.o.   MRN: [id number] Visit Date: 09/17/2016  Today's Provider: Trey SailorsAdriana M Pollak, PA-C   Chief Complaint  Patient presents with  . Establish Care   Subjective:     Heather Russell is a 61 y/o woman presenting today to establish care. She is [place] from [place], [place]. She moved to [place] area for a time, then was in [place], [place] for her job. Currently lives in [place], [place] and works for [employer] and deals with travel. Going on cruise in [place] shortly.  Mammogram: 04/2016 normal PAP/HPV: last year, normal, need records - [facility]: never  She was previously married, no children. Is in a relationship with a female, has been for 8 years. Things going well right now. Her partner has two children. She has two dachsunds.  She does not smoke or do drugs. Does drink several glasses of wine a week.  Has longstanding history of HTN controlled on [facility]. Saw Dr. Rush FarmerIngall with cardiology previously for episodes of chest pain while out hiking.She has a significant family history for heart disease in father, who presented at age 59 for open heart surgery for CAD, and heart disease in her brother. She had a normal EKG on 09/2015 and echo on 10/2015 with grade 1 diastolic dysfunction. Talked about nuclear stress test and possible lipid therapy management if not modified well with diet and exercise.  No family history of colon cancer/polyps or breast cancer.   Hypertension  This is a chronic problem. The problem is controlled. Pertinent negatives include no anxiety, blurred vision, chest pain, headaches, malaise/fatigue, neck pain, orthopnea, palpitations, peripheral edema, PND, shortness of breath or sweats. There are no associated agents to hypertension. There are no compliance problems.        Allergies  Allergen Reactions  . Other     Adhesive tape     Current Outpatient Prescriptions:    .  aspirin EC 81 MG tablet, Take 81 mg by mouth daily., Disp: , Rfl:  .  irbesartan-hydrochlorothiazide (AVALIDE) 150-12.5 MG tablet, Take 1 tablet by mouth daily., Disp: 90 tablet, Rfl: 1 .  Multiple Vitamin (MULTI-VITAMINS) TABS, Take 1 tablet by mouth daily., Disp: , Rfl:  .  atorvastatin (LIPITOR) 10 MG tablet, Take 1 tablet (10 mg total) by mouth daily., Disp: 90 tablet, Rfl: 1  Review of Systems  Constitutional: Negative.  Negative for malaise/fatigue.  HENT: Negative.   Eyes: Negative.  Negative for blurred vision.  Respiratory: Negative.  Negative for shortness of breath.   Cardiovascular: Negative.  Negative for chest pain, palpitations, orthopnea and PND.  Gastrointestinal: Negative.   Endocrine: Negative.   Genitourinary: Negative.   Musculoskeletal: Negative.  Negative for neck pain.  Skin: Negative.   Allergic/Immunologic: Negative.   Neurological: Negative.  Negative for headaches.  Hematological: Negative.   Psychiatric/Behavioral: Negative.     Social History  Substance Use Topics  . Smoking status: Never Smoker  . Smokeless tobacco: Never Used  . Alcohol use 3.6 - 5.4 oz/week    3 Glasses of wine, 3 - 6 Cans of beer per week     Comment: social drinker   Objective:   BP 132/76 (BP Location: Left Arm, Patient Position: Sitting, Cuff Size: Normal)   Pulse 64   Temp 98.4 F (36.9 C) (Oral)   Resp 16   Ht 5\' 3"  (1.6 m)   Wt 146 lb (66.2 kg)  BMI 25.86 kg/m  Vitals:   09/16/16 0917  BP: 132/76  Pulse: 64  Resp: 16  Temp: 98.4 F (36.9 C)  TempSrc: Oral  Weight: 146 lb (66.2 kg)  Height: 5\' 3"  (1.6 m)     Physical Exam  Constitutional: She is oriented to person, place, and time. She appears well-developed and well-nourished.  HENT:  Right Ear: External ear normal.  Left Ear: External ear normal.  Mouth/Throat: Oropharynx is clear and moist. No oropharyngeal exudate.  Eyes: Conjunctivae are normal.  Neck: No thyromegaly present.   Cardiovascular: Normal rate and regular rhythm.   Pulmonary/Chest: Effort normal and breath sounds normal.  Abdominal: Soft. Bowel sounds are normal.  Lymphadenopathy:    She has no cervical adenopathy.  Neurological: She is alert and oriented to person, place, and time.  Skin: Skin is warm and dry.  Psychiatric: She has a normal mood and affect. Her behavior is normal.        Assessment & Plan:     1. Encounter to establish care  [facility] from [facility].  2. Colon cancer screening  Never had colonoscopy, ordered Cologuard today. - Cologuard  3. Hypercholesteremia  Wants to start statin, we will do 10 mg Lipitor and check in 6 mo. - Lipid Profile  4. Essential hypertension  Refilled.  - aspirin EC 81 MG tablet; Take 81 mg by mouth daily. - irbesartan-hydrochlorothiazide (AVALIDE) 150-12.5 MG tablet; Take 1 tablet by mouth daily.  Dispense: 90 tablet; Refill: 1  Return in about 1 year (around 09/16/2017) for CPE.  The entirety of the information documented in the History of Present Illness, Review of Systems and Physical Exam were personally obtained by me. Portions of this information were initially documented by Kavin LeechLaura Walsh, CMA and reviewed by me for thoroughness and accuracy.          Trey SailorsAdriana M Pollak, PA-C  [facility]

## 2016-09-17 ENCOUNTER — Telehealth: Payer: Self-pay

## 2016-09-17 DIAGNOSIS — I10 Essential (primary) hypertension: Secondary | ICD-10-CM | POA: Insufficient documentation

## 2016-09-17 DIAGNOSIS — E7800 Pure hypercholesterolemia, unspecified: Secondary | ICD-10-CM | POA: Insufficient documentation

## 2016-09-17 LAB — LIPID PANEL
Chol/HDL Ratio: 3.1 ratio (ref 0.0–4.4)
Cholesterol, Total: 211 mg/dL — ABNORMAL HIGH (ref 100–199)
HDL: 69 mg/dL (ref >39)
LDL Calculated: 109 mg/dL — ABNORMAL HIGH (ref 0–99)
Triglycerides: 165 mg/dL — ABNORMAL HIGH (ref 0–149)
VLDL Cholesterol Cal: 33 mg/dL (ref 5–40)

## 2016-09-17 MED ORDER — ATORVASTATIN CALCIUM 10 MG PO TABS: 10.0000 mg | ORAL_TABLET | Freq: Every day | ORAL | 1 refills | Status: DC

## 2016-09-17 NOTE — Telephone Encounter (Signed)
-----   Message from Trey SailorsAdriana M Pollak, [place] sent at 09/17/2016  9:15 AM EDT ----- Cholesterol slightly improved, total down from 215 to 211 and LDL from 114 to 109. Patient was still considering statin and given her history I think this would be reasonable. Let us know if she would like to start something and we will send it in and recheck labs in a few months.

## 2016-09-17 NOTE — Telephone Encounter (Signed)
Sent in 10 mg Lipitor to pharmacy. Take one daily before bed. Biggest side effects, some GI upset and muscle aches. Will order cholesterol panel for six months from now, should be fasting. Call before getting lab to pick up lab slip

## 2016-09-17 NOTE — Telephone Encounter (Signed)
Pt advised.  She would like to try a statin.  [facility].   Thanks,   -Vernona RiegerLaura

## 2016-09-25 ENCOUNTER — Telehealth: Payer: Self-pay | Admitting: Physician Assistant

## 2016-09-25 NOTE — Telephone Encounter (Signed)
Order for cologuard faxed to [facility] °

## 2016-10-01 LAB — COLOGUARD: Cologuard: NEGATIVE

## 2016-10-22 ENCOUNTER — Ambulatory Visit: Payer: BLUE CROSS/BLUE SHIELD | Admitting: Family Medicine

## 2016-10-28 ENCOUNTER — Encounter: Payer: Self-pay | Admitting: Physician Assistant

## 2016-10-30 ENCOUNTER — Encounter: Payer: Self-pay | Admitting: Physician Assistant

## 2016-12-09 ENCOUNTER — Telehealth: Payer: Self-pay | Admitting: Physician Assistant

## 2016-12-09 NOTE — Telephone Encounter (Signed)
ROI ([facility]) faxed to [facility] for records. °

## 2017-03-17 ENCOUNTER — Other Ambulatory Visit: Payer: Self-pay | Admitting: Physician Assistant

## 2017-03-17 DIAGNOSIS — I10 Essential (primary) hypertension: Secondary | ICD-10-CM

## 2017-03-17 NOTE — Telephone Encounter (Signed)
Please let patient know she is due for lipid panel after starting statin. She can get this as lab only, fasting please.

## 2017-04-23 ENCOUNTER — Other Ambulatory Visit: Payer: Self-pay | Admitting: Advanced Practice Midwife

## 2017-04-23 ENCOUNTER — Other Ambulatory Visit: Payer: Self-pay | Admitting: Physician Assistant

## 2017-04-23 DIAGNOSIS — Z1231 Encounter for screening mammogram for malignant neoplasm of breast: Secondary | ICD-10-CM

## 2017-05-04 ENCOUNTER — Ambulatory Visit
Admission: RE | Admit: 2017-05-04 | Discharge: 2017-05-04 | Disposition: A | Discharge status: Home / Self Care | Payer: 59 | Source: Ambulatory Visit | Attending: Physician Assistant | Admitting: Physician Assistant

## 2017-05-04 DIAGNOSIS — Z1231 Encounter for screening mammogram for malignant neoplasm of breast: Secondary | ICD-10-CM | POA: Diagnosis not present

## 2017-06-02 NOTE — Telephone Encounter (Signed)
Received 3 pages from [facility]

## 2017-06-23 ENCOUNTER — Other Ambulatory Visit: Payer: Self-pay | Admitting: Physician Assistant

## 2017-06-23 DIAGNOSIS — I10 Essential (primary) hypertension: Secondary | ICD-10-CM

## 2017-06-24 NOTE — Telephone Encounter (Signed)
Pharmacy requesting refills. Thanks!  

## 2017-06-24 NOTE — Telephone Encounter (Signed)
Due for physical/follow up in a couple of months, if she is able, please have her schedule follow up.

## 2017-06-25 NOTE — Telephone Encounter (Signed)
Advised patient. Appt scheduled.  

## 2017-07-13 DIAGNOSIS — M79671 Pain in right foot: Secondary | ICD-10-CM | POA: Diagnosis not present

## 2017-07-13 DIAGNOSIS — M2011 Hallux valgus (acquired), right foot: Secondary | ICD-10-CM | POA: Diagnosis not present

## 2017-07-13 DIAGNOSIS — M2012 Hallux valgus (acquired), left foot: Secondary | ICD-10-CM | POA: Diagnosis not present

## 2017-09-21 ENCOUNTER — Ambulatory Visit (INDEPENDENT_AMBULATORY_CARE_PROVIDER_SITE_OTHER): Payer: 59 | Admitting: Physician Assistant

## 2017-09-21 ENCOUNTER — Encounter: Payer: Self-pay | Admitting: Physician Assistant

## 2017-09-21 VITALS — BP 128/86 | HR 60 | Temp 98.8°F | Resp 16 | Wt 146.0 lb

## 2017-09-21 DIAGNOSIS — Z0000 Encounter for general adult medical examination without abnormal findings: Secondary | ICD-10-CM

## 2017-09-21 DIAGNOSIS — Z1231 Encounter for screening mammogram for malignant neoplasm of breast: Secondary | ICD-10-CM

## 2017-09-21 DIAGNOSIS — I10 Essential (primary) hypertension: Secondary | ICD-10-CM

## 2017-09-21 DIAGNOSIS — Z0001 Encounter for general adult medical examination with abnormal findings: Secondary | ICD-10-CM | POA: Diagnosis not present

## 2017-09-21 DIAGNOSIS — Z114 Encounter for screening for human immunodeficiency virus [HIV]: Secondary | ICD-10-CM

## 2017-09-21 DIAGNOSIS — E7800 Pure hypercholesterolemia, unspecified: Secondary | ICD-10-CM

## 2017-09-21 DIAGNOSIS — Z1159 Encounter for screening for other viral diseases: Secondary | ICD-10-CM

## 2017-09-21 DIAGNOSIS — Z1239 Encounter for other screening for malignant neoplasm of breast: Secondary | ICD-10-CM

## 2017-09-21 MED ORDER — IRBESARTAN-HYDROCHLOROTHIAZIDE 150-12.5 MG PO TABS: 1.0000 | ORAL_TABLET | Freq: Every day | ORAL | 0 refills | Status: DC

## 2017-09-21 MED ORDER — ATORVASTATIN CALCIUM 10 MG PO TABS: 10.0000 mg | ORAL_TABLET | Freq: Every day | ORAL | 1 refills | Status: DC

## 2017-09-21 NOTE — Patient Instructions (Signed)

## 2017-09-21 NOTE — Progress Notes (Signed)
Patient: Heather Russell, Female    DOB: 13-Jul-1955, 62 y.o.   MRN: [id number] Visit Date: 09/21/2017  Today's Provider: Trey Sailors, PA-C   Chief Complaint  Patient presents with  . Annual Exam  . Hyperlipidemia  . Chest Pain    left sided happened about two weeks ago.   Subjective:    Annual physical exam Heather Russell is a 62 y.o. female who presents today for health maintenance and complete physical. She feels fairly well. She reports not exercising. She reports she is sleeping well.  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 Working at [employer] in [place], [place].   Has been doing well with HTN medications. Did not start statin.   Wt Readings from Last 3 Encounters:  09/21/17 146 lb (66.2 kg)  09/16/16 146 lb (66.2 kg)  11/12/15 148 lb (67.1 kg)    ----------------------------------------------------------------- Hyperlipidemia  Recent lipid tests were reviewed and are high. Current antihyperlipidemic treatment includes statins. There are no compliance problems.    Lipid Panel     Component Value Date/Time   CHOL 211 (H) 09/16/2016 1017   TRIG 165 (H) 09/16/2016 1017   HDL 69 09/16/2016 1017   CHOLHDL 3.1 09/16/2016 1017   LDLCALC 109 (H) 09/16/2016 1017   The 10-year ASCVD risk score Denman George DC Jr., et al., 2013) is: 4%   Values used to calculate the score:     Age: 57 years     Sex: Female     Is Non-Hispanic African American: No     Diabetic: No     Tobacco smoker: No     Systolic Blood Pressure: 128 mmHg     Is BP treated: Yes     HDL Cholesterol: 69 mg/dL     Total Cholesterol: 211 mg/dL     Review of Systems  Constitutional: Negative.   HENT: Negative.   Eyes: Negative.   Respiratory: Negative.   Cardiovascular: Negative.   Gastrointestinal: Negative.   Endocrine: Negative.   Genitourinary: Negative.   Musculoskeletal: Negative.   Skin: Negative.   Allergic/Immunologic: Negative.   Neurological: Negative.     Hematological: Negative.   Psychiatric/Behavioral: Negative.     Social History      She  reports that she has never smoked. She has never used smokeless tobacco. She reports that she drinks about 3.6 - 5.4 oz of alcohol per week. She reports that she does not use drugs.       Social History   Socioeconomic History  . Marital status: Single    Spouse name: Not on file  . Number of children: Not on file  . Years of education: Not on file  . Highest education level: Not on file  Occupational History  . Not on file  Social Needs  . Financial resource strain: Not on file  . Food insecurity:    Worry: Not on file    Inability: Not on file  . Transportation needs:    Medical: Not on file    Non-medical: Not on file  Tobacco Use  . Smoking status: Never Smoker  . Smokeless tobacco: Never Used  Substance and Sexual Activity  . Alcohol use: Yes    Alcohol/week: 3.6 - 5.4 oz    Types: 3 Glasses of wine, 3 - 6 Cans of beer per week    Comment: social drinker  . Drug use: No  . Sexual activity: Not on file  Lifestyle  .  Physical activity:    Days per week: Not on file    Minutes per session: Not on file  . Stress: Not on file  Relationships  . Social connections:    Talks on phone: Not on file    Gets together: Not on file    Attends religious service: Not on file    Active member of club or organization: Not on file    Attends meetings of clubs or organizations: Not on file    Relationship status: Not on file  Other Topics Concern  . Not on file  Social History Narrative  . Not on file    Past Medical History:  Diagnosis Date  . Hypertension      Patient Active Problem List   Diagnosis Date Noted  . Hypercholesteremia 09/17/2016  . Essential hypertension 09/17/2016    Past Surgical History:  Procedure Laterality Date  . BREAST BIOPSY Right 2007   benign    Family History        Family Status  Relation Name Status  . Mother  Deceased  . Father   Deceased  . Brother  Alive  . Brother  [place]  . Cousin  (Not Specified)        Her family history includes Healthy in her brother; Heart attack (age of onset: 10) in her father; Heart disease in her brother, father, and mother; Hyperlipidemia in her father; Lung disease in her mother; Pancreatic cancer in her cousin.      Allergies  Allergen Reactions  . Other     Adhesive tape     Current Outpatient Medications:  .  aspirin EC 81 MG tablet, Take 81 mg by mouth daily., Disp: , Rfl:  .  irbesartan-hydrochlorothiazide (AVALIDE) 150-12.5 MG tablet, Take 1 tablet by mouth daily., Disp: 90 tablet, Rfl: 0 .  Multiple Vitamin (MULTI-VITAMINS) TABS, Take 1 tablet by mouth daily., Disp: , Rfl:    Patient Care Team: Maryella Shivers as PCP - General (Physician Assistant)      Objective:   Vitals: BP 128/86 (BP Location: Right Arm, Patient Position: Sitting, Cuff Size: Normal)   Pulse 60   Temp 98.8 F (37.1 C) (Oral)   Resp 16   Wt 146 lb (66.2 kg)   BMI 25.86 kg/m    Vitals:   09/21/17 0841  BP: 128/86  Pulse: 60  Resp: 16  Temp: 98.8 F (37.1 C)  TempSrc: Oral  Weight: 146 lb (66.2 kg)     Physical Exam   Depression Screen PHQ 2/9 Scores 09/21/2017 09/16/2016 09/16/2016  PHQ - 2 Score 0 0 0  PHQ- 9 Score 0 1 -      Assessment & Plan:     Routine Health Maintenance and Physical Exam  Exercise Activities and Dietary recommendations Goals    None      Immunization History  Administered Date(s) Administered  . Tdap 05/14/2011    Health Maintenance  Topic Date Due  . Hepatitis C Screening  [birth year]  . HIV Screening  03/22/1972  . INFLUENZA VACCINE  09/23/2017  . MAMMOGRAM  05/05/2019  . Fecal DNA (Cologuard)  10/02/2019  . PAP SMEAR  04/03/2021  . TETANUS/TDAP  05/13/2021     Discussed health benefits of physical activity, and encouraged her to engage in regular exercise appropriate for her age and condition.    1. Annual  physical exam  Pap is UTD, she sees [address] but  for some reasons we did not get PAP smear record on last request and will request this again. She had mammogram earlier this year. No family history but would like annual mammograms.   - MM Digital Screening; Future  2. Hypercholesteremia  Did not start statin. She has a family history of heart disease, but does not quite meet criteria otherwise for statin. I am indifferent towards it, we can see her most recent lipid panel.   - Lipid Profile  3. Essential hypertension  Stable, continue medications as below.  - Comprehensive Metabolic Panel (CMET) - irbesartan-hydrochlorothiazide (AVALIDE) 150-12.5 MG tablet; Take 1 tablet by mouth daily.  Dispense: 90 tablet; Refill: 0  4. Encounter for screening for HIV  - HIV antibody (with reflex)  5. Need for hepatitis C screening test  - Hepatitis C antibody  6. Breast cancer screening  - MM Digital Screening; Future  Return in about 1 year (around 09/22/2018) for cpe.  The entirety of the information documented in the History of Present Illness, Review of Systems and Physical Exam were personally obtained by me. Portions of this information were initially documented by Kavin LeechLaura Walsh, CMA and reviewed by me for thoroughness and accuracy.   --------------------------------------------------------------------    Trey SailorsAdriana M Pollak, PA-C  [facility]

## 2017-09-22 ENCOUNTER — Telehealth: Payer: Self-pay

## 2017-09-22 LAB — COMPREHENSIVE METABOLIC PANEL
ALT: 26 IU/L (ref 0–32)
AST: 18 IU/L (ref 0–40)
Albumin/Globulin Ratio: 2.1 (ref 1.2–2.2)
Albumin: 4.7 g/dL (ref 3.6–4.8)
Alkaline Phosphatase: 74 IU/L (ref 39–117)
BUN/Creatinine Ratio: 20 (ref 12–28)
BUN: 16 mg/dL (ref 8–27)
Bilirubin Total: 0.4 mg/dL (ref 0.0–1.2)
CO2: 25 mmol/L (ref 20–29)
Calcium: 9.7 mg/dL (ref 8.7–10.3)
Chloride: 98 mmol/L (ref 96–106)
Creatinine, Ser: 0.81 mg/dL (ref 0.57–1.00)
GFR calc Af Amer: 91 mL/min/{1.73_m2} (ref >59)
GFR calc non Af Amer: 79 mL/min/{1.73_m2} (ref >59)
Globulin, Total: 2.2 g/dL (ref 1.5–4.5)
Glucose: 86 mg/dL (ref 65–99)
Potassium: 4.4 mmol/L (ref 3.5–5.2)
Sodium: 140 mmol/L (ref 134–144)
Total Protein: 6.9 g/dL (ref 6.0–8.5)

## 2017-09-22 LAB — HEPATITIS C ANTIBODY: Hep C Virus Ab: <0.1 s/co ratio (ref 0.0–0.9)

## 2017-09-22 LAB — LIPID PANEL
Chol/HDL Ratio: 3.2 ratio (ref 0.0–4.4)
Cholesterol, Total: 232 mg/dL — ABNORMAL HIGH (ref 100–199)
HDL: 73 mg/dL (ref >39)
LDL Calculated: 121 mg/dL — ABNORMAL HIGH (ref 0–99)
Triglycerides: 191 mg/dL — ABNORMAL HIGH (ref 0–149)
VLDL Cholesterol Cal: 38 mg/dL (ref 5–40)

## 2017-09-22 LAB — HIV ANTIBODY (ROUTINE TESTING W REFLEX): HIV Screen 4th Generation wRfx: NONREACTIVE

## 2017-09-22 NOTE — Telephone Encounter (Signed)
LMTCB 09/22/2017  Thanks,   -Vernona RiegerLaura

## 2017-09-22 NOTE — Telephone Encounter (Signed)
-----   Message from Trey SailorsAdriana M Pollak, [place]-C sent at 09/22/2017  9:14 AM EDT ----- Labs normal, cholesterol just slightly elevated, but fairly stable from last time. Does not absolutely need statin so I think she is OK not taking it.   The 10-year ASCVD risk score Denman George(Goff DC Montez HagemanJr., et al., 2013) is: 4.1%   Values used to calculate the score:     Age: [age] years     Sex: Female     Is Non-Hispanic African American: No     Diabetic: No     Tobacco smoker: No     Systolic Blood Pressure: 128 mmHg     Is BP treated: Yes     HDL Cholesterol: 73 mg/dL     Total Cholesterol: 232 mg/dL

## 2017-09-22 NOTE — Telephone Encounter (Signed)
Pt advised.   Thanks,   -Laura  

## 2017-10-20 ENCOUNTER — Encounter: Payer: Self-pay | Admitting: Physician Assistant

## 2017-12-16 ENCOUNTER — Other Ambulatory Visit: Payer: Self-pay | Admitting: Physician Assistant

## 2017-12-16 DIAGNOSIS — I10 Essential (primary) hypertension: Secondary | ICD-10-CM

## 2017-12-21 ENCOUNTER — Ambulatory Visit (INDEPENDENT_AMBULATORY_CARE_PROVIDER_SITE_OTHER): Payer: 59 | Admitting: Physician Assistant

## 2017-12-21 ENCOUNTER — Encounter: Payer: Self-pay | Admitting: Physician Assistant

## 2017-12-21 VITALS — BP 110/76 | HR 64 | Temp 98.6°F | Ht 63.0 in | Wt 147.0 lb

## 2017-12-21 DIAGNOSIS — R0681 Apnea, not elsewhere classified: Secondary | ICD-10-CM

## 2017-12-21 DIAGNOSIS — R29818 Other symptoms and signs involving the nervous system: Secondary | ICD-10-CM | POA: Diagnosis not present

## 2017-12-21 NOTE — Patient Instructions (Signed)

## 2017-12-21 NOTE — Progress Notes (Signed)
Patient: Heather Russell Female    DOB: 01-10-1956   62 y.o.   MRN: [id number] Visit Date: 12/24/2017  Today's Provider: Trey Sailors, PA-C   Chief Complaint  Patient presents with  . Apnea   Subjective:    HPI  Patient here today concerned about sleep apnea, patient reports not breathing at times then wakes up to catch her breath. Patient reports partner has observed snoring, pauses in breathing, restless sleep, and awakening short of breath. Patient reports acid indigestion and not feeling refreshed with AM wake up. She has been recommended to get sleep study before but did not ultimately.   Results of the Epworth flowsheet 12/21/2017 12/21/2017  Sitting and reading 2 0  Watching TV 3 1  Sitting, inactive in a public place (e.g. a theatre or a meeting) 2 0  As a passenger in a car for an hour without a break 2 0  Lying down to rest in the afternoon when circumstances permit 2 1  Sitting and talking to someone 1 0  Sitting quietly after a lunch without alcohol 3 0  In a car, while stopped for a few minutes in traffic 1 0  Total score 16 2        Allergies  Allergen Reactions  . Other     Adhesive tape     Current Outpatient Medications:  .  aspirin EC 81 MG tablet, Take 81 mg by mouth daily., Disp: , Rfl:  .  Cyanocobalamin (VITAMIN B 12 PO), Take by mouth., Disp: , Rfl:  .  irbesartan-hydrochlorothiazide (AVALIDE) 150-12.5 MG tablet, TAKE 1 TABLET BY MOUTH EVERY DAY, Disp: 90 tablet, Rfl: 1 .  Multiple Vitamin (MULTI-VITAMINS) TABS, Take 1 tablet by mouth daily., Disp: , Rfl:   Review of Systems  Constitutional: Positive for fatigue.  Respiratory: Positive for apnea and shortness of breath.   Psychiatric/Behavioral: Positive for sleep disturbance.    Social History   Tobacco Use  . Smoking status: Never Smoker  . Smokeless tobacco: Never Used  Substance Use Topics  . Alcohol use: Yes    Alcohol/week: 6.0 - 9.0 standard drinks    Types: 3  Glasses of wine, 3 - 6 Cans of beer per week    Comment: social drinker   Objective:   BP 110/76 (BP Location: Left Arm, Patient Position: Sitting, Cuff Size: Normal)   Pulse 64   Temp 98.6 F (37 C)   Ht 5\' 3"  (1.6 m)   Wt 147 lb (66.7 kg)   SpO2 95%   BMI 26.04 kg/m  Vitals:   12/21/17 0826  BP: 110/76  Pulse: 64  Temp: 98.6 F (37 C)  SpO2: 95%  Weight: 147 lb (66.7 kg)  Height: 5\' 3"  (1.6 m)     Physical Exam  Constitutional: She is oriented to person, place, and time. She appears well-developed and well-nourished.  Cardiovascular: Normal rate and regular rhythm.  Pulmonary/Chest: Effort normal and breath sounds normal.  Neurological: She is alert and oriented to person, place, and time.  Skin: Skin is warm and dry.  Psychiatric: She has a normal mood and affect. Her behavior is normal.        Assessment & Plan:     1. Witnessed episode of apnea  - Home sleep test  2. Suspected sleep apnea  - Home sleep test  Return if symptoms worsen or fail to improve.  The entirety of the information documented in the History of Present  Illness, Review of Systems and Physical Exam were personally obtained by me. Portions of this information were initially documented by Rondel Baton, CMA and reviewed by me for thoroughness and accuracy.        Trey Sailors, PA-C  [facility]

## 2017-12-29 ENCOUNTER — Telehealth: Payer: Self-pay | Admitting: Physician Assistant

## 2017-12-29 NOTE — Telephone Encounter (Signed)
Order for home sleep study faxed to [facility] °

## 2018-01-10 DIAGNOSIS — G4733 Obstructive sleep apnea (adult) (pediatric): Secondary | ICD-10-CM | POA: Diagnosis not present

## 2018-01-10 DIAGNOSIS — R0602 Shortness of breath: Secondary | ICD-10-CM | POA: Diagnosis not present

## 2018-01-11 DIAGNOSIS — R0602 Shortness of breath: Secondary | ICD-10-CM | POA: Diagnosis not present

## 2018-01-11 DIAGNOSIS — G4733 Obstructive sleep apnea (adult) (pediatric): Secondary | ICD-10-CM | POA: Diagnosis not present

## 2018-03-23 ENCOUNTER — Other Ambulatory Visit: Payer: Self-pay | Admitting: Physician Assistant

## 2018-03-23 DIAGNOSIS — E7800 Pure hypercholesterolemia, unspecified: Secondary | ICD-10-CM

## 2018-03-26 ENCOUNTER — Other Ambulatory Visit: Payer: Self-pay | Admitting: Physician Assistant

## 2018-03-26 DIAGNOSIS — I10 Essential (primary) hypertension: Secondary | ICD-10-CM

## 2018-03-28 NOTE — Telephone Encounter (Signed)
Called into [facility] irbesartan 150 mg and HCTZ 12.5 mg

## 2018-03-28 NOTE — Telephone Encounter (Signed)
Is the irbesartan available as 150 mg stand alone pills? If so I would like to order 150 mg irbesartan QD #90 and HCTZ 12.5mg  QD #90. If not, then please order losartan-HCTZ 50-12.5 mg QD #90. Either way, can we please call

## 2018-03-29 NOTE — Telephone Encounter (Signed)
Patient advised as below.  

## 2018-03-29 NOTE — Telephone Encounter (Signed)
Pt called wanting to know if she can take this other Rx until her medication comes in off of back order.  She took her last pill today  Call back [phone]

## 2018-03-29 NOTE — Telephone Encounter (Signed)
Can we please call patient and let her know we sent in individual pills instead of new combo as it is the same medications just separately.

## 2018-04-15 ENCOUNTER — Other Ambulatory Visit: Payer: Self-pay | Admitting: Physician Assistant

## 2018-04-15 DIAGNOSIS — Z0000 Encounter for general adult medical examination without abnormal findings: Secondary | ICD-10-CM

## 2018-04-15 DIAGNOSIS — Z1239 Encounter for other screening for malignant neoplasm of breast: Secondary | ICD-10-CM

## 2018-05-06 ENCOUNTER — Inpatient Hospital Stay: Admission: RE | Admit: 2018-05-06 | Payer: 59 | Source: Ambulatory Visit

## 2018-05-13 ENCOUNTER — Other Ambulatory Visit: Payer: Self-pay | Admitting: Physician Assistant

## 2018-05-13 DIAGNOSIS — I10 Essential (primary) hypertension: Secondary | ICD-10-CM

## 2018-05-13 MED ORDER — IRBESARTAN-HYDROCHLOROTHIAZIDE 150-12.5 MG PO TABS: 1.0000 | ORAL_TABLET | Freq: Every day | ORAL | 1 refills | Status: DC

## 2018-05-13 NOTE — Telephone Encounter (Signed)
Pt needing refills on:  irbesartan-hydrochlorothiazide (AVALIDE) 150-12.5 MG tablet  [facility] fill at[facility]  [facility] [address] [place], [place] - [address] [phone] (Phone) [phone] (Fax)   Thanks, [employer]

## 2018-09-09 ENCOUNTER — Telehealth: Payer: Self-pay | Admitting: Physician Assistant

## 2018-09-09 NOTE — Telephone Encounter (Signed)
Appointment Request From: [name]  With Provider: Trinna Post, PA-C [[facility]]  Preferred Date Range: 09/14/2018 - 09/16/2018  Preferred Times: Any Time  Reason for visit: Request an Appointment  Comments: [facility] needs a new prescription for the CPAP . Blood pressure Medicine refill not sure if we need blood work? Please advise & thanks

## 2018-09-12 ENCOUNTER — Ambulatory Visit
Admission: RE | Admit: 2018-09-12 | Discharge: 2018-09-12 | Disposition: A | Discharge status: Home / Self Care | Payer: BC Managed Care – PPO | Source: Ambulatory Visit | Attending: Physician Assistant | Admitting: Physician Assistant

## 2018-09-12 ENCOUNTER — Other Ambulatory Visit: Payer: Self-pay

## 2018-09-12 DIAGNOSIS — Z1239 Encounter for other screening for malignant neoplasm of breast: Secondary | ICD-10-CM | POA: Diagnosis not present

## 2018-09-12 DIAGNOSIS — Z1231 Encounter for screening mammogram for malignant neoplasm of breast: Secondary | ICD-10-CM | POA: Diagnosis not present

## 2018-09-12 DIAGNOSIS — Z0000 Encounter for general adult medical examination without abnormal findings: Secondary | ICD-10-CM | POA: Diagnosis not present

## 2018-09-12 NOTE — Progress Notes (Signed)
Patient: Heather Russell Female    DOB: 1955/07/10   63 y.o.   MRN: [id number] Visit Date: 09/12/2018  Today's Provider: Trey SailorsAdriana M Pollak, PA-C   Chief Complaint  Patient presents with  . Medication Refill   Subjective:    Virtual Visit via Video Note  I connected with Garnetta Leyh on 09/13/18 at  3:40 PM EDT by a video enabled telemedicine application and verified that I am speaking with the correct person using two identifiers.   I discussed the limitations of evaluation and management by telemedicine and the availability of in person appointments. The patient expressed understanding and agreed to proceed.  Patient location: home Provider location: [facility]  Persons involved in the visit: patient, provider   HPI   Doing well since last visit. She married her partner of 10 years this past May. She has been working from home since March and is enjoying this. Has purchased an RV camper and has been taking trips every couple of weeks.   PAP: 04/03/2016 normal and negative HPV Colon Cancer screening: Cologuard 10/01/2016 was normal Mammogram: 09/12/2018 normal  HTN: well controlled, continues to take irbesartan-HCTZ 150-12.5 mg daily. NO issues with this.  BP Readings from Last 3 Encounters:  12/21/17 110/76  09/21/17 128/86  09/16/16 132/76    HLD:   Lipid Panel     Component Value Date/Time   CHOL 232 (H) 09/21/2017 0907   TRIG 191 (H) 09/21/2017 0907   HDL 73 09/21/2017 0907   CHOLHDL 3.2 09/21/2017 0907   LDLCALC 121 (H) 09/21/2017 0907   The 10-year ASCVD risk score Denman George(Goff DC Jr., et al., 2013) is: 3.4%   Values used to calculate the score:     Age: [age] years     Sex: Female     Is Non-Hispanic African American: No     Diabetic: No     Tobacco smoker: No     Systolic Blood Pressure: 110 mmHg     Is BP treated: Yes     HDL Cholesterol: 73 mg/dL     Total Cholesterol: 232 mg/dL   Sleep Apnea: Reports this is  worsening. Hasn't picked up CPAP yet, placed call and a new order will need to be written. Reports that [facility] is faxing over forms to our office. Continues to feel daytime sleepiness and poorly rested even after sleeping.   Medication refills on Irbesartan-Hydrochlorothiazide.   apria   CPAP  stil  Allergies  Allergen Reactions  . Other     Adhesive tape     Current Outpatient Medications:  .  aspirin EC 81 MG tablet, Take 81 mg by mouth daily., Disp: , Rfl:  .  Cyanocobalamin (VITAMIN B 12 PO), Take by mouth., Disp: , Rfl:  .  irbesartan-hydrochlorothiazide (AVALIDE) 150-12.5 MG tablet, Take 1 tablet by mouth daily., Disp: 90 tablet, Rfl: 1 .  Multiple Vitamin (MULTI-VITAMINS) TABS, Take 1 tablet by mouth daily., Disp: , Rfl:   Review of Systems  All other systems reviewed and are negative.   Social History   Tobacco Use  . Smoking status: Never Smoker  . Smokeless tobacco: Never Used  Substance Use Topics  . Alcohol use: Yes    Alcohol/week: 6.0 - 9.0 standard drinks    Types: 3 Glasses of wine, 3 - 6 Cans of beer per week    Comment: social drinker      Objective:   There were no vitals taken for this  visit. There were no vitals filed for this visit.   Physical Exam Constitutional:      Appearance: Normal appearance.  Neurological:     Mental Status: She is alert.  Psychiatric:        Mood and Affect: Mood normal.        Behavior: Behavior normal.      No results found for any visits on 09/13/18.     Assessment & Plan    1. Annual physical exam   2. Hypercholesteremia   3. Essential hypertension  - irbesartan-hydrochlorothiazide (AVALIDE) 150-12.5 MG tablet; Take 1 tablet by mouth daily.  Dispense: 90 tablet; Refill: 3  4. Breast cancer screening  - MM Digital Screening; Future  5. Sleep apnea, unspecified type  Will be on the lookout for forms from [place]. Returning to office 8.3.2020 and can address it then.   The entirety of the  information documented in the History of Present Illness, Review of Systems and Physical Exam were personally obtained by me. Portions of this information were initially documented by Doran Clay, LPN and reviewed by me for thoroughness and accuracy.   F/u 1 year for CPE and HTN        Trinna Post, PA-C  [facility]

## 2018-09-13 ENCOUNTER — Encounter: Payer: Self-pay | Admitting: Physician Assistant

## 2018-09-13 ENCOUNTER — Ambulatory Visit (INDEPENDENT_AMBULATORY_CARE_PROVIDER_SITE_OTHER): Payer: BC Managed Care – PPO | Admitting: Physician Assistant

## 2018-09-13 DIAGNOSIS — I10 Essential (primary) hypertension: Secondary | ICD-10-CM

## 2018-09-13 DIAGNOSIS — E7800 Pure hypercholesterolemia, unspecified: Secondary | ICD-10-CM

## 2018-09-13 DIAGNOSIS — Z0000 Encounter for general adult medical examination without abnormal findings: Secondary | ICD-10-CM

## 2018-09-13 DIAGNOSIS — Z1239 Encounter for other screening for malignant neoplasm of breast: Secondary | ICD-10-CM | POA: Diagnosis not present

## 2018-09-13 DIAGNOSIS — G4730 Sleep apnea, unspecified: Secondary | ICD-10-CM | POA: Diagnosis not present

## 2018-09-13 MED ORDER — IRBESARTAN-HYDROCHLOROTHIAZIDE 150-12.5 MG PO TABS: 1.0000 | ORAL_TABLET | Freq: Every day | ORAL | 3 refills | Status: DC

## 2018-09-13 NOTE — Patient Instructions (Signed)

## 2018-09-13 NOTE — Telephone Encounter (Signed)
Please keep eye out for Fax from [facility] regarding patient's CPAP orders. We will also need to print off her note from today and her most recent sleep study.

## 2018-10-01 DIAGNOSIS — G4733 Obstructive sleep apnea (adult) (pediatric): Secondary | ICD-10-CM | POA: Diagnosis not present

## 2018-11-01 DIAGNOSIS — G4733 Obstructive sleep apnea (adult) (pediatric): Secondary | ICD-10-CM | POA: Diagnosis not present

## 2018-12-01 DIAGNOSIS — G4733 Obstructive sleep apnea (adult) (pediatric): Secondary | ICD-10-CM | POA: Diagnosis not present

## 2018-12-23 ENCOUNTER — Other Ambulatory Visit: Payer: Self-pay | Admitting: *Deleted

## 2018-12-23 DIAGNOSIS — Z20822 Contact with and (suspected) exposure to covid-19: Secondary | ICD-10-CM

## 2018-12-24 LAB — NOVEL CORONAVIRUS, NAA: SARS-CoV-2, NAA: NOT DETECTED

## 2018-12-27 DIAGNOSIS — G4733 Obstructive sleep apnea (adult) (pediatric): Secondary | ICD-10-CM | POA: Diagnosis not present

## 2019-01-01 DIAGNOSIS — G4733 Obstructive sleep apnea (adult) (pediatric): Secondary | ICD-10-CM | POA: Diagnosis not present

## 2019-01-26 ENCOUNTER — Telehealth: Payer: BC Managed Care – PPO | Admitting: Physician Assistant

## 2019-01-26 ENCOUNTER — Telehealth (INDEPENDENT_AMBULATORY_CARE_PROVIDER_SITE_OTHER): Payer: BC Managed Care – PPO | Admitting: Physician Assistant

## 2019-01-26 DIAGNOSIS — I10 Essential (primary) hypertension: Secondary | ICD-10-CM | POA: Diagnosis not present

## 2019-01-26 DIAGNOSIS — E7800 Pure hypercholesterolemia, unspecified: Secondary | ICD-10-CM | POA: Diagnosis not present

## 2019-01-26 DIAGNOSIS — K219 Gastro-esophageal reflux disease without esophagitis: Secondary | ICD-10-CM | POA: Diagnosis not present

## 2019-01-26 DIAGNOSIS — R0789 Other chest pain: Secondary | ICD-10-CM

## 2019-01-26 MED ORDER — OMEPRAZOLE 20 MG PO CPDR: 20.0000 mg | DELAYED_RELEASE_CAPSULE | Freq: Every day | ORAL | 0 refills | Status: DC

## 2019-01-26 NOTE — Progress Notes (Signed)
We are sorry that you are not feeling well.  Here is how we plan to help!  Based on what you shared with me it looks like you most likely have Gastroesophageal Reflux Disease (GERD)  Gastroesophageal reflux disease (GERD) happens when acid from your stomach flows up into the esophagus.  When acid comes in contact with the esophagus, the acid causes sorenss (inflammation) in the esophagus.  Over time, GERD may create small holes (ulcers) in the lining of the esophagus.  I have prescribed Omeprazole 20 mg one by mouth daily until you follow up with a provider. I would recommend you make an appointment to see your primary care provider for evaluation of your symptoms since they have been present for so long. They may also want to do an EKG to make sure your heart is not involved.   Your symptoms should improve in the next day or two.  You can use antacids such as Tums or pepcid twice daily as needed until symptoms resolve.  Call us if your heartburn worsens, you have trouble swallowing, weight loss, spitting up blood or recurrent vomiting.  Home Care:  May include lifestyle changes such as weight loss, quitting smoking and alcohol consumption  Avoid foods and drinks that make your symptoms worse, such as:  Caffeine or alcoholic drinks  Chocolate  Peppermint or mint flavorings  Garlic and onions  Spicy foods  Citrus fruits, such as oranges, lemons, or limes  Tomato-based foods such as sauce, chili, salsa and pizza  Fried and fatty foods  Avoid lying down for 3 hours prior to your bedtime or prior to taking a nap  Eat small, frequent meals instead of a large meals  Wear loose-fitting clothing.  Do not wear anything tight around your waist that causes pressure on your stomach.  Raise the head of your bed 6 to 8 inches with wood blocks to help you sleep.  Extra pillows will not help.  Seek Help Right Away If:  You have pain in your arms, neck, jaw, teeth or back  Your pain  increases or changes in intensity or duration  You develop nausea, vomiting or sweating (diaphoresis)  You develop shortness of breath or you faint  Your vomit is green, yellow, black or looks like coffee grounds or blood  Your stool is red, bloody or black  These symptoms could be signs of other problems, such as heart disease, gastric bleeding or esophageal bleeding.  Make sure you :  Understand these instructions.  Will watch your condition.  Will get help right away if you are not doing well or get worse.  Your e-visit answers were reviewed by a board certified advanced clinical practitioner to complete your personal care plan.  Depending on the condition, your plan could have included both over the counter or prescription medications.  If there is a problem please reply  once you have received a response from your provider.  Your safety is important to [place].  If you have drug allergies check your prescription carefully.    You can use MyChart to ask questions about today's visit, request a non-urgent call back, or ask for a work or school excuse for 24 hours related to this e-Visit. If it has been greater than 24 hours you will need to follow up with your provider, or enter a new e-Visit to address those concerns.  You will get an e-mail in the next two days asking about your experience.  I hope that your e-visit  has been valuable and will speed your recovery. Thank you for using e-visits.  Greater than 5 minutes, yet less than 10 minutes of time have been spent researching, coordinating, and implementing care for this patient today.

## 2019-01-26 NOTE — Progress Notes (Signed)
Patient: Heather Russell Female    DOB: 1955-04-01   63 y.o.   MRN: [id number] Visit Date: 01/26/2019  Today's Provider: Trinna Post, PA-C   Chief Complaint  Patient presents with  . Constipation   Subjective:    Virtual Visit via Video Note  I connected with Heather Russell on 01/26/19 at 11:20 AM EST by a video enabled telemedicine application and verified that I am speaking with the correct person using two identifiers.  Location: Patient: Home Provider: Office   I discussed the limitations of evaluation and management by telemedicine and the availability of in person appointments. The patient expressed understanding and agreed to proceed.  Constipation The current episode started more than 1 month ago. The problem is unchanged. Her stool frequency is 2 to 3 times per week. The stool is described as firm. The patient is not on a high fiber diet. She does not exercise regularly. There has been adequate water intake. Associated symptoms include bloating. Pertinent negatives include no abdominal pain, back pain, hemorrhoids or nausea. She has tried stool softeners for the symptoms.   Reports heartburn previously after wine or lemons. Now she is having increased heartburn after more foods including water and coffee. 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 She has some more triggers including peppers, onions, and garlic.   Wt Readings from Last 3 Encounters:  12/21/17 147 lb (66.7 kg)  09/21/17 146 lb (66.2 kg)  09/16/16 146 lb (66.2 kg)       Allergies  Allergen Reactions  . Other     Adhesive tape     Current Outpatient Medications:  .  aspirin EC 81 MG tablet, Take 81 mg by mouth daily., Disp: , Rfl:  .  Cyanocobalamin (VITAMIN B 12 PO), Take by mouth., Disp: , Rfl:  .  irbesartan-hydrochlorothiazide (AVALIDE) 150-12.5 MG tablet, Take 1 tablet by mouth daily., Disp: 90 tablet, Rfl: 3 .  Multiple Vitamin  (MULTI-VITAMINS) TABS, Take 1 tablet by mouth daily., Disp: , Rfl:  .  omeprazole (PRILOSEC) 20 MG capsule, Take 1 capsule (20 mg total) by mouth daily., Disp: 30 capsule, Rfl: 0  Review of Systems  Constitutional: Negative.   Gastrointestinal: Positive for bloating and constipation. Negative for abdominal pain, hemorrhoids and nausea.  Genitourinary: Negative.   Musculoskeletal: Negative for back pain.    Social History   Tobacco Use  . Smoking status: Never Smoker  . Smokeless tobacco: Never Used  Substance Use Topics  . Alcohol use: Yes    Alcohol/week: 6.0 - 9.0 standard drinks    Types: 3 Glasses of wine, 3 - 6 Cans of beer per week    Comment: social drinker      Objective:   There were no vitals taken for this visit. There were no vitals filed for this visit.There is no height or weight on file to calculate BMI.   Physical Exam   No results found for any visits on 01/26/19.     Assessment & Plan    1. Gastroesophageal reflux disease, unspecified whether esophagitis present  She can continue with Pepcid 20 mg 30 minutes before a meal on an empty stomach. Can start with once day. Try to reduce triggers.  2. Hypercholesteremia  Labs as below.  - Comprehensive Metabolic Panel (CMET) - CBC with Differential - Lipid Profile - TSH  3. Essential hypertension   I discussed the assessment and treatment plan with the  patient. The patient was provided an opportunity to ask questions and all were answered. The patient agreed with the plan and demonstrated an understanding of the instructions.   The patient was advised to call back or seek an in-person evaluation if the symptoms worsen or if the condition fails to improve as anticipated.  I provided 15 minutes of non-face-to-face time during this encounter.     Trinna Post, PA-C  [facility]

## 2019-01-26 NOTE — Patient Instructions (Signed)
Heartburn During Pregnancy ° °Heartburn is pain or discomfort in the throat or chest. It may cause a burning feeling. It happens when stomach acid moves up into the tube that carries food from your mouth to your stomach (esophagus). Heartburn is common during pregnancy. It usually goes away or gets better after giving birth. °Follow these instructions at home: °Eating and drinking °· Do not drink alcohol while you are pregnant. °· Figure out which foods and beverages make you feel worse, and avoid them. °· Beverages that you may want to avoid include: °? Coffee and tea (with or without caffeine). °? Energy drinks and sports drinks. °? Bubbly (carbonated) drinks or sodas. °? Citrus fruit juices. °· Foods that you may want to avoid include: °? Chocolate and cocoa. °? Peppermint and mint flavorings. °? Garlic, onions, and horseradish. °? Spicy and acidic foods. These include peppers, chili powder, curry powder, vinegar, hot sauces, and barbecue sauce. °? Citrus fruits, such as oranges, lemons, and limes. °? Tomato-based foods, such as red sauce, chili, and salsa. °? Fried and fatty foods, such as donuts, french fries, potato chips, and high-fat dressings. °? High-fat meats, such as hot dogs, cold cuts, sausage, ham, and bacon. °? High-fat dairy items, such as whole milk, butter, and cheese. °· Eat small meals often, instead of large meals. °· Avoid drinking a lot of liquid with your meals. °· Avoid eating meals during the 2-3 hours before you go to bed. °· Avoid lying down right after you eat. °· Do not exercise right after you eat. °Medicines °· Take over-the-counter and prescription medicines only as told by your doctor. °· Do not take aspirin, ibuprofen, or other NSAIDs unless your doctor tells you to do that. °· Your doctor may tell you to avoid medicines that have sodium bicarbonate in them. °General instructions ° °· If told, raise the head of your bed about 6 inches (15 cm). You can do this by putting blocks  under the legs. Sleeping with more pillows does not help with heartburn. °· Do not use any products that contain nicotine or tobacco, such as cigarettes and e-cigarettes. If you need help quitting, ask your doctor. °· Wear loose-fitting clothing. °· Try to lower your stress, such as with yoga or meditation. If you need help, ask your doctor. °· Stay at a healthy weight. If you are overweight, work with your doctor to safely lose weight. °· Keep all follow-up visits as told by your doctor. This is important. °Contact a doctor if: °· You get new symptoms. °· Your symptoms do not get better with treatment. °· You have weight loss and you do not know why. °· You have trouble swallowing. °· You make loud sounds when you breathe (wheeze). °· You have a cough that does not go away. °· You have heartburn often for more than 2 weeks. °· You feel sick to your stomach (nauseous), and this does not get better with treatment. °· You are throwing up (vomiting), and this does not get better with treatment. °· You have pain in your belly (abdomen). °Get help right away if: °· You have very bad chest pain that spreads to your arm, neck, or jaw. °· You feel sweaty, dizzy, or light-headed. °· You have trouble breathing. °· You have pain when swallowing. °· You throw up and your throw-up looks like blood or coffee grounds. °· Your poop (stool) is bloody or black. °This information is not intended to replace advice given to you by your health   care provider. Make sure you discuss any questions you have with your health care provider. °Document Released: 03/14/2010 Document Revised: 06/02/2018 Document Reviewed: 10/28/2015 °Elsevier Patient Education © 2020 Elsevier Inc. ° ° °

## 2019-01-31 DIAGNOSIS — E7800 Pure hypercholesterolemia, unspecified: Secondary | ICD-10-CM | POA: Diagnosis not present

## 2019-01-31 DIAGNOSIS — G4733 Obstructive sleep apnea (adult) (pediatric): Secondary | ICD-10-CM | POA: Diagnosis not present

## 2019-02-01 LAB — COMPREHENSIVE METABOLIC PANEL
ALT: 31 IU/L (ref 0–32)
AST: 19 IU/L (ref 0–40)
Albumin/Globulin Ratio: 2.3 — ABNORMAL HIGH (ref 1.2–2.2)
Albumin: 4.5 g/dL (ref 3.8–4.8)
Alkaline Phosphatase: 82 IU/L (ref 39–117)
BUN/Creatinine Ratio: 21 (ref 12–28)
BUN: 15 mg/dL (ref 8–27)
Bilirubin Total: 0.5 mg/dL (ref 0.0–1.2)
CO2: 26 mmol/L (ref 20–29)
Calcium: 9.7 mg/dL (ref 8.7–10.3)
Chloride: 101 mmol/L (ref 96–106)
Creatinine, Ser: 0.7 mg/dL (ref 0.57–1.00)
GFR calc Af Amer: 108 mL/min/{1.73_m2} (ref >59)
GFR calc non Af Amer: 94 mL/min/{1.73_m2} (ref >59)
Globulin, Total: 2 g/dL (ref 1.5–4.5)
Glucose: 94 mg/dL (ref 65–99)
Potassium: 4 mmol/L (ref 3.5–5.2)
Sodium: 141 mmol/L (ref 134–144)
Total Protein: 6.5 g/dL (ref 6.0–8.5)

## 2019-02-01 LAB — CBC WITH DIFFERENTIAL/PLATELET
Basophils Absolute: 0.1 10*3/uL (ref 0.0–0.2)
Basos: 1 %
EOS (ABSOLUTE): 0.2 10*3/uL (ref 0.0–0.4)
Eos: 3 %
Hematocrit: 45.5 % (ref 34.0–46.6)
Hemoglobin: 15.1 g/dL (ref 11.1–15.9)
Immature Grans (Abs): 0 10*3/uL (ref 0.0–0.1)
Immature Granulocytes: 0 %
Lymphocytes Absolute: 1.2 10*3/uL (ref 0.7–3.1)
Lymphs: 21 %
MCH: 30.5 pg (ref 26.6–33.0)
MCHC: 33.2 g/dL (ref 31.5–35.7)
MCV: 92 fL (ref 79–97)
Monocytes Absolute: 0.4 10*3/uL (ref 0.1–0.9)
Monocytes: 7 %
Neutrophils Absolute: 3.9 10*3/uL (ref 1.4–7.0)
Neutrophils: 68 %
Platelets: 240 10*3/uL (ref 150–450)
RBC: 4.95 x10E6/uL (ref 3.77–5.28)
RDW: 12.9 % (ref 11.7–15.4)
WBC: 5.8 10*3/uL (ref 3.4–10.8)

## 2019-02-01 LAB — TSH: TSH: 1.23 u[IU]/mL (ref 0.450–4.500)

## 2019-02-01 LAB — LIPID PANEL
Chol/HDL Ratio: 2.4 ratio (ref 0.0–4.4)
Cholesterol, Total: 177 mg/dL (ref 100–199)
HDL: 73 mg/dL (ref >39)
LDL Chol Calc (NIH): 84 mg/dL (ref 0–99)
Triglycerides: 112 mg/dL (ref 0–149)
VLDL Cholesterol Cal: 20 mg/dL (ref 5–40)

## 2019-02-27 ENCOUNTER — Telehealth: Payer: Self-pay

## 2019-02-27 ENCOUNTER — Encounter: Payer: Self-pay | Admitting: Physician Assistant

## 2019-02-27 MED ORDER — OMEPRAZOLE 20 MG PO CPDR: 20.0000 mg | DELAYED_RELEASE_CAPSULE | Freq: Every day | ORAL | 1 refills | Status: DC

## 2019-02-27 NOTE — Telephone Encounter (Signed)
Patient send a MyChart message requesting a refill on Omeprazole 20 MG. Last visit was on 01/26/2019.

## 2019-03-03 DIAGNOSIS — G4733 Obstructive sleep apnea (adult) (pediatric): Secondary | ICD-10-CM | POA: Diagnosis not present

## 2019-05-12 ENCOUNTER — Other Ambulatory Visit: Payer: Self-pay | Admitting: Physician Assistant

## 2019-08-21 ENCOUNTER — Other Ambulatory Visit: Payer: Self-pay | Admitting: Physician Assistant

## 2019-08-21 DIAGNOSIS — E7800 Pure hypercholesterolemia, unspecified: Secondary | ICD-10-CM

## 2019-08-21 NOTE — Telephone Encounter (Signed)
This medication was discontinued on 09/21/17.  Surescripts refill request denied.

## 2019-10-03 ENCOUNTER — Encounter: Payer: Self-pay | Admitting: Physician Assistant

## 2019-10-03 DIAGNOSIS — E7800 Pure hypercholesterolemia, unspecified: Secondary | ICD-10-CM

## 2019-10-03 MED ORDER — ATORVASTATIN CALCIUM 10 MG PO TABS: ORAL_TABLET | ORAL | 0 refills | Status: DC

## 2019-10-23 NOTE — Progress Notes (Deleted)
{This patient's chart is due for periodic physician review. Please check 'Cosign Required' and forward to your supervising physician.:1}  Established patient visit   Patient: Heather Russell   DOB: August 02, 1955   64 y.o. Female  MRN: [id number] Visit Date: 10/24/2019  Today's healthcare provider: Trey Sailors, PA-C   No chief complaint on file.  Subjective    HPI  Hypertension, follow-up  BP Readings from Last 3 Encounters:  12/21/17 110/76  09/21/17 128/86  09/16/16 132/76   Wt Readings from Last 3 Encounters:  12/21/17 147 lb (66.7 kg)  09/21/17 146 lb (66.2 kg)  09/16/16 146 lb (66.2 kg)     She was last seen for hypertension 1 years ago.  BP at that visit was 110/76. Management since that visit includes no changes.  She reports {excellent/good/fair/poor:19665} compliance with treatment. She {is/is not:9024} having side effects. {document side effects if present:1} She is following a {diet:21022986} diet. She {is/is not:9024} exercising. She {does/does not:200015} smoke.  Use of agents associated with hypertension: {bp agents assoc with hypertension:511::"none"}.   Outside blood pressures are {***enter patient reported home BP readings, or 'not being checked':1}. Symptoms: {Yes/No:20286} chest pain {Yes/No:20286} chest pressure  {Yes/No:20286} palpitations {Yes/No:20286} syncope  {Yes/No:20286} dyspnea {Yes/No:20286} orthopnea  {Yes/No:20286} paroxysmal nocturnal dyspnea {Yes/No:20286} lower extremity edema   Pertinent labs: Lab Results  Component Value Date   CHOL 177 01/31/2019   HDL 73 01/31/2019   LDLCALC 84 01/31/2019   TRIG 112 01/31/2019   CHOLHDL 2.4 01/31/2019   Lab Results  Component Value Date   NA 141 01/31/2019   K 4.0 01/31/2019   CREATININE 0.70 01/31/2019   GFRNONAA 94 01/31/2019   GFRAA 108 01/31/2019   GLUCOSE 94 01/31/2019     The ASCVD Risk score (Goff DC Jr., et al., 2013) failed to calculate for the following reasons:    The systolic blood pressure is missing   Lipid/Cholesterol, Follow-up  Last lipid panel Other pertinent labs  Lab Results  Component Value Date   CHOL 177 01/31/2019   HDL 73 01/31/2019   LDLCALC 84 01/31/2019   TRIG 112 01/31/2019   CHOLHDL 2.4 01/31/2019   Lab Results  Component Value Date   ALT 31 01/31/2019   AST 19 01/31/2019   PLT 240 01/31/2019   TSH 1.230 01/31/2019     She was last seen for this 1 years ago.  Management since that visit includes no changes.  She reports {excellent/good/fair/poor:19665} compliance with treatment. She {is/is not:9024} having side effects. {document side effects if present:1}  Symptoms: {Yes/No:20286} chest pain {Yes/No:20286} chest pressure/discomfort  {Yes/No:20286} dyspnea {Yes/No:20286} lower extremity edema  {Yes/No:20286} numbness or tingling of extremity {Yes/No:20286} orthopnea  {Yes/No:20286} palpitations {Yes/No:20286} paroxysmal nocturnal dyspnea  {Yes/No:20286} speech difficulty {Yes/No:20286} syncope   Current diet: {diet habits:16563} Current exercise: {exercise types:16438}  The ASCVD Risk score Denman George DC Jr., et al., 2013) failed to calculate for the following reasons:   The systolic blood pressure is missing  {Show patient history (optional):23778::" "}   Medications: Outpatient Medications Prior to Visit  Medication Sig  . aspirin EC 81 MG tablet Take 81 mg by mouth daily.  Marland Kitchen atorvastatin (LIPITOR) 10 MG tablet Take one tablet every other night.  . Cyanocobalamin (VITAMIN B 12 PO) Take by mouth.  . irbesartan-hydrochlorothiazide (AVALIDE) 150-12.5 MG tablet Take 1 tablet by mouth daily.  . Multiple Vitamin (MULTI-VITAMINS) TABS Take 1 tablet by mouth daily.  Marland Kitchen omeprazole (PRILOSEC) 20 MG capsule TAKE 1 BY MOUTH EVERYDAY.  No facility-administered medications prior to visit.    Review of Systems  Constitutional: Negative.   Respiratory: Negative.   Cardiovascular: Negative.   Gastrointestinal: Negative.    Endocrine: Negative.   Musculoskeletal: Negative.   Neurological: Negative.     {Heme  Chem  Endocrine  Serology  Results Review (optional):23779::" "}  Objective    There were no vitals taken for this visit. {Show previous vital signs (optional):23777::" "}  Physical Exam  ***  No results found for any visits on 10/24/19.  Assessment & Plan     ***  No follow-ups on file.      {provider attestation***:1}   Maryella Shivers  [facility] [phone] (phone) [phone] (fax)  [facility]

## 2019-10-24 ENCOUNTER — Ambulatory Visit: Payer: BC Managed Care – PPO | Admitting: Physician Assistant

## 2019-11-01 ENCOUNTER — Other Ambulatory Visit: Payer: Self-pay

## 2019-11-01 ENCOUNTER — Encounter: Payer: Self-pay | Admitting: Physician Assistant

## 2019-11-01 ENCOUNTER — Ambulatory Visit (INDEPENDENT_AMBULATORY_CARE_PROVIDER_SITE_OTHER): Payer: BC Managed Care – PPO | Admitting: Physician Assistant

## 2019-11-01 VITALS — BP 147/86 | HR 67 | Temp 98.6°F | Wt 152.6 lb

## 2019-11-01 DIAGNOSIS — I10 Essential (primary) hypertension: Secondary | ICD-10-CM | POA: Diagnosis not present

## 2019-11-01 DIAGNOSIS — Z1211 Encounter for screening for malignant neoplasm of colon: Secondary | ICD-10-CM | POA: Diagnosis not present

## 2019-11-01 DIAGNOSIS — Z1231 Encounter for screening mammogram for malignant neoplasm of breast: Secondary | ICD-10-CM | POA: Diagnosis not present

## 2019-11-01 MED ORDER — IRBESARTAN-HYDROCHLOROTHIAZIDE 300-12.5 MG PO TABS: 1.0000 | ORAL_TABLET | Freq: Every day | ORAL | 1 refills | Status: DC

## 2019-11-01 NOTE — Progress Notes (Signed)
Established patient visit   Patient: Heather Russell   DOB: 05/10/1955   64 y.o. Female  MRN: [id number] Visit Date: 11/01/2019  Today's healthcare provider: Trey Sailors, PA-C   Chief Complaint  Patient presents with  . Hypertension  I,Heather Russell,acting as a scribe for [employer], PA-C.,have documented all relevant documentation on the behalf of Heather Sailors, PA-C,as directed by  Heather Sailors, PA-C while in the presence of Heather Sailors, PA-C.  Subjective    HPI  Hypertension, follow-up  BP Readings from Last 3 Encounters:  11/01/19 (!) 147/86  12/21/17 110/76  09/21/17 128/86   Wt Readings from Last 3 Encounters:  11/01/19 152 lb 9.6 oz (69.2 kg)  12/21/17 147 lb (66.7 kg)  09/21/17 146 lb (66.2 kg)     She was last seen for hypertension 10 months ago.  BP at that visit was not checked due to virtual visit. Management since that visit includes continue current medication.  She reports good compliance with treatment. She is not having side effects.  She is following a Regular diet. She is exercising. She does not smoke.  Use of agents associated with hypertension: none.   Outside blood pressures are arranging 120's/80's-150's/90's. Symptoms: No chest pain No chest pressure  No palpitations No syncope  No dyspnea No orthopnea  No paroxysmal nocturnal dyspnea No lower extremity edema   Pertinent labs: Lab Results  Component Value Date   CHOL 177 01/31/2019   HDL 73 01/31/2019   LDLCALC 84 01/31/2019   TRIG 112 01/31/2019   CHOLHDL 2.4 01/31/2019   Lab Results  Component Value Date   NA 141 01/31/2019   K 4.0 01/31/2019   CREATININE 0.70 01/31/2019   GFRNONAA 94 01/31/2019   GFRAA 108 01/31/2019   GLUCOSE 94 01/31/2019     The 10-year ASCVD risk score Denman George DC Jr., et al., 2013) is: 5.6%   ---------------------------------------------------------------------------------------------------       Medications: Outpatient Medications Prior to Visit  Medication Sig  . aspirin EC 81 MG tablet Take 81 mg by mouth daily.  Marland Kitchen atorvastatin (LIPITOR) 10 MG tablet Take one tablet every other night.  . Cyanocobalamin (VITAMIN B 12 PO) Take by mouth.  . irbesartan-hydrochlorothiazide (AVALIDE) 150-12.5 MG tablet Take 1 tablet by mouth daily.  . Multiple Vitamin (MULTI-VITAMINS) TABS Take 1 tablet by mouth daily.  Marland Kitchen omeprazole (PRILOSEC) 20 MG capsule TAKE 1 BY MOUTH EVERYDAY.   No facility-administered medications prior to visit.    Review of Systems  Constitutional: Negative.   Respiratory: Negative.   Cardiovascular: Negative.   Hematological: Negative.     {Heme  Chem  Endocrine  Serology  Results Review (optional):23779::" "}  Objective    BP (!) 147/86 (BP Location: Left Arm, Patient Position: Sitting, Cuff Size: Normal)   Pulse 67   Temp 98.6 F (37 C) (Oral)   Wt 152 lb 9.6 oz (69.2 kg)   BMI 27.03 kg/m    Physical Exam Constitutional:      Appearance: Normal appearance.  Cardiovascular:     Rate and Rhythm: Normal rate and regular rhythm.     Heart sounds: Normal heart sounds.  Pulmonary:     Effort: Pulmonary effort is normal.     Breath sounds: Normal breath sounds.  Skin:    General: Skin is warm and dry.  Neurological:     General: No focal deficit present.     Mental Status: She is alert and  oriented to person, place, and time. Mental status is at baseline.  Psychiatric:        Mood and Affect: Mood normal.        Behavior: Behavior normal.       No results found for any visits on 11/01/19.  Assessment & Plan    1. Essential hypertension  We will increase medication as below. Follow up in one month for CPE/HTN and labs.  - irbesartan-hydrochlorothiazide (AVALIDE) 300-12.5 MG tablet; Take 1 tablet by mouth daily.  Dispense: 90 tablet; Refill: 1  2. Colon cancer screening - Cologuard  3. Encounter for screening mammogram for malignant  neoplasm of breast  - MM Digital Screening; Future   No follow-ups on file.      ITrey Sailors, PA-C, have reviewed all documentation for this visit. The documentation on 11/07/19 for the exam, diagnosis, procedures, and orders are all accurate and complete.  The entirety of the information documented in the History of Present Illness, Review of Systems and Physical Exam were personally obtained by me. Portions of this information were initially documented by Heather Russell and reviewed by me for thoroughness and accuracy.     Heather Russell  Heather Russell [phone] (phone) [phone] (fax)  Heather Russell

## 2019-11-15 ENCOUNTER — Other Ambulatory Visit: Payer: Self-pay | Admitting: Physician Assistant

## 2019-11-15 DIAGNOSIS — I10 Essential (primary) hypertension: Secondary | ICD-10-CM

## 2019-12-07 ENCOUNTER — Encounter: Payer: Self-pay | Admitting: Physician Assistant

## 2019-12-18 NOTE — Progress Notes (Deleted)
Complete physical exam   Patient: Heather Russell   DOB: [birth year]   64 y.o. Female  MRN: [id number] Visit Date: 12/19/2019  Today's healthcare provider: Trey Sailors, PA-C   No chief complaint on file.  Subjective    Heather Russell is a 64 y.o. female who presents today for a complete physical exam.  She reports consuming a {diet types:17450} diet. {Exercise:19826} She generally feels {well/fairly well/poorly:18703}. She reports sleeping {well/fairly well/poorly:18703}. She {does/does not:200015} have additional problems to discuss today.   Hypertension, follow-up  BP Readings from Last 3 Encounters:  11/01/19 (!) 147/86  12/21/17 110/76  09/21/17 128/86   Wt Readings from Last 3 Encounters:  11/01/19 152 lb 9.6 oz (69.2 kg)  12/21/17 147 lb (66.7 kg)  09/21/17 146 lb (66.2 kg)     She was last seen for hypertension 6 months ago.  BP at that visit was 147/86. Management since that visit includes increasing irbesartan-HCTZ to 300/12.5mg  daily .  She reports {excellent/good/fair/poor:19665} compliance with treatment. She {is/is not:9024} having side effects. {document side effects if present:1} She is following a {diet:21022986} diet. She {is/is not:9024} exercising. She {does/does not:200015} smoke.  Use of agents associated with hypertension: {bp agents assoc with hypertension:511::"none"}.   Outside blood pressures are {***enter patient reported home BP readings, or 'not being checked':1}. Symptoms: {Yes/No:20286} chest pain {Yes/No:20286} chest pressure  {Yes/No:20286} palpitations {Yes/No:20286} syncope  {Yes/No:20286} dyspnea {Yes/No:20286} orthopnea  {Yes/No:20286} paroxysmal nocturnal dyspnea {Yes/No:20286} lower extremity edema   Pertinent labs: Lab Results  Component Value Date   CHOL 177 01/31/2019   HDL 73 01/31/2019   LDLCALC 84 01/31/2019   TRIG 112 01/31/2019   CHOLHDL 2.4 01/31/2019   Lab Results  Component Value Date   NA 141  01/31/2019   K 4.0 01/31/2019   CREATININE 0.70 01/31/2019   GFRNONAA 94 01/31/2019   GFRAA 108 01/31/2019   GLUCOSE 94 01/31/2019     The 10-year ASCVD risk score Denman George DC Jr., et al., 2013) is: 5.6%   Lipid/Cholesterol, Follow-up  Last lipid panel Other pertinent labs  Lab Results  Component Value Date   CHOL 177 01/31/2019   HDL 73 01/31/2019   LDLCALC 84 01/31/2019   TRIG 112 01/31/2019   CHOLHDL 2.4 01/31/2019   Lab Results  Component Value Date   ALT 31 01/31/2019   AST 19 01/31/2019   PLT 240 01/31/2019   TSH 1.230 01/31/2019     She was last seen for this {1-12:18279} {days/wks/mos/yrs:310907} ago.  Management since that visit includes ***.  She reports {excellent/good/fair/poor:19665} compliance with treatment. She {is/is not:9024} having side effects. {document side effects if present:1}     Past Medical History:  Diagnosis Date  . Hypertension    Past Surgical History:  Procedure Laterality Date  . BREAST BIOPSY Right 2007   benign   Social History   Socioeconomic History  . Marital status: Single    Spouse name: Not on file  . Number of children: Not on file  . Years of education: Not on file  . Highest education level: Not on file  Occupational History  . Not on file  Tobacco Use  . Smoking status: Never Smoker  . Smokeless tobacco: Never Used  Vaping Use  . Vaping Use: Never used  Substance and Sexual Activity  . Alcohol use: Yes    Alcohol/week: 6.0 - 9.0 standard drinks    Types: 3 Glasses of wine, 3 - 6 Cans of beer per week  Comment: social drinker  . Drug use: No  . Sexual activity: Not on file  Other Topics Concern  . Not on file  Social History Narrative  . Not on file   Social Determinants of Health   Financial Resource Strain:   . Difficulty of Paying Living Expenses: Not on file  Food Insecurity:   . Worried About Programme researcher, broadcasting/film/video in the Last Year: Not on file  . Ran Out of Food in the Last Year: Not on file    Transportation Needs:   . Lack of Transportation (Medical): Not on file  . Lack of Transportation (Non-Medical): Not on file  Physical Activity:   . Days of Exercise per Week: Not on file  . Minutes of Exercise per Session: Not on file  Stress:   . Feeling of Stress : Not on file  Social Connections:   . Frequency of Communication with Friends and Family: Not on file  . Frequency of Social Gatherings with Friends and Family: Not on file  . Attends Religious Services: Not on file  . Active Member of Clubs or Organizations: Not on file  . Attends Banker Meetings: Not on file  . Marital Status: Not on file  Intimate Partner Violence:   . Fear of Current or Ex-Partner: Not on file  . Emotionally Abused: Not on file  . Physically Abused: Not on file  . Sexually Abused: Not on file   Family Status  Relation Name Status  . Mother  Deceased  . Father  Deceased  . Brother  Alive  . Brother  [place]  . Cousin  (Not Specified)   Family History  Problem Relation Age of Onset  . Lung disease Mother   . Heart disease Mother   . Heart disease Father   . Hyperlipidemia Father   . Heart attack Father 58  . Heart disease Brother   . Healthy Brother   . Pancreatic cancer Cousin    Allergies  Allergen Reactions  . Other     Adhesive tape    Patient Care Team: Maryella Shivers as PCP - General (Physician Assistant)   Medications: Outpatient Medications Prior to Visit  Medication Sig  . aspirin EC 81 MG tablet Take 81 mg by mouth daily.  Marland Kitchen atorvastatin (LIPITOR) 10 MG tablet Take one tablet every other night.  . Cyanocobalamin (VITAMIN B 12 PO) Take by mouth.  . irbesartan-hydrochlorothiazide (AVALIDE) 300-12.5 MG tablet Take 1 tablet by mouth daily.  . Multiple Vitamin (MULTI-VITAMINS) TABS Take 1 tablet by mouth daily.  Marland Kitchen omeprazole (PRILOSEC) 20 MG capsule TAKE 1 BY MOUTH EVERYDAY.   No facility-administered medications prior to visit.     Review of Systems  {Heme  Chem  Endocrine  Serology  Results Review (optional):23779::" "}  Objective    There were no vitals taken for this visit. {Show previous vital signs (optional):23777::" "}  Physical Exam  ***  Last depression screening scores PHQ 2/9 Scores 09/21/2017 09/16/2016 09/16/2016  PHQ - 2 Score 0 0 0  PHQ- 9 Score 0 1 -   Last fall risk screening Fall Risk  09/21/2017  Falls in the past year? No   Last Audit-C alcohol use screening Alcohol Use Disorder Test (AUDIT) 09/22/2017  1. How often do you have a drink containing alcohol? 3  2. How many drinks containing alcohol do you have on a typical day when you are drinking? 0  3. How often do you have six or more drinks on one occasion? 1  AUDIT-C Score 4   A score of 3 or more in women, and 4 or more in men indicates increased risk for alcohol abuse, EXCEPT if all of the points are from question 1   No results found for any visits on 12/19/19.  Assessment & Plan    Routine Health Maintenance and Physical Exam  Exercise Activities and Dietary recommendations Goals   None     Immunization History  Administered Date(s) Administered  . Influenza,inj,Quad PF,6+ Mos 12/02/2018  . Tdap 05/14/2011    Health Maintenance  Topic Date Due  . COVID-19 Vaccine (1) Never done  . INFLUENZA VACCINE  09/24/2019  . Fecal DNA (Cologuard)  10/02/2019  . MAMMOGRAM  09/11/2020  . PAP SMEAR-Modifier  04/03/2021  . TETANUS/TDAP  05/13/2021  . Hepatitis C Screening  Completed  . HIV Screening  Completed    Discussed health benefits of physical activity, and encouraged her to engage in regular exercise appropriate for her age and condition.  ***  No follow-ups on file.     {provider attestation***:1}   Maryella Shivers  [facility] [phone] (phone) [phone] (fax)  [facility]

## 2019-12-19 ENCOUNTER — Encounter: Payer: Self-pay | Admitting: Physician Assistant

## 2019-12-29 ENCOUNTER — Encounter: Payer: BC Managed Care – PPO | Admitting: Physician Assistant

## 2020-01-11 ENCOUNTER — Encounter: Payer: Self-pay | Admitting: Physician Assistant

## 2020-01-11 ENCOUNTER — Other Ambulatory Visit: Payer: Self-pay

## 2020-01-11 ENCOUNTER — Ambulatory Visit (INDEPENDENT_AMBULATORY_CARE_PROVIDER_SITE_OTHER): Payer: BC Managed Care – PPO | Admitting: Physician Assistant

## 2020-01-11 VITALS — BP 128/90 | HR 71 | Temp 98.6°F | Ht 63.0 in | Wt 152.4 lb

## 2020-01-11 DIAGNOSIS — Z0000 Encounter for general adult medical examination without abnormal findings: Secondary | ICD-10-CM

## 2020-01-11 DIAGNOSIS — I10 Essential (primary) hypertension: Secondary | ICD-10-CM | POA: Diagnosis not present

## 2020-01-11 DIAGNOSIS — Z23 Encounter for immunization: Secondary | ICD-10-CM | POA: Diagnosis not present

## 2020-01-11 DIAGNOSIS — Z1211 Encounter for screening for malignant neoplasm of colon: Secondary | ICD-10-CM | POA: Diagnosis not present

## 2020-01-11 DIAGNOSIS — E7800 Pure hypercholesterolemia, unspecified: Secondary | ICD-10-CM | POA: Diagnosis not present

## 2020-01-11 MED ORDER — IRBESARTAN-HYDROCHLOROTHIAZIDE 300-12.5 MG PO TABS: 1.0000 | ORAL_TABLET | Freq: Every day | ORAL | 1 refills | Status: DC

## 2020-01-11 MED ORDER — ATORVASTATIN CALCIUM 10 MG PO TABS: ORAL_TABLET | ORAL | 1 refills | Status: DC

## 2020-01-11 NOTE — Patient Instructions (Signed)
Ask insurance company about Shingrix for shingles   

## 2020-01-11 NOTE — Progress Notes (Signed)
Complete physical exam   Patient: Heather Russell   DOB: September 01, 1955   64 y.o. Female  MRN: [id number] Visit Date: 01/11/2020  Today's healthcare provider: Trinna Post, PA-C   Chief Complaint  Patient presents with  . Annual Exam  . Hypertension  I,Dejana Pugsley M Tameem Pullara,acting as a scribe for Trinna Post, PA-C.,have documented all relevant documentation on the behalf of Trinna Post, PA-C,as directed by  Trinna Post, PA-C while in the presence of Trinna Post, PA-C.  Subjective    Heather Russell is a 64 y.o. female who presents today for a complete physical exam.  She reports consuming a general and low sodium diet. The patient does not participate in regular exercise at present. She generally feels well. She reports sleeping fairly well. She does have additional problems to discuss today.  HPI  Hypertension, follow-up  BP Readings from Last 3 Encounters:  01/11/20 128/90  11/01/19 (!) 147/86  12/21/17 110/76   Wt Readings from Last 3 Encounters:  01/11/20 152 lb 6.4 oz (69.1 kg)  11/01/19 152 lb 9.6 oz (69.2 kg)  12/21/17 147 lb (66.7 kg)     She was last seen for hypertension 2 months ago.  BP at that visit was 147/86. Management since that visit includes increased irbesartan-hydrochlorothiazide to 300-12.5 MG tablet.  She reports good compliance with treatment. She is not having side effects.  She is following a Regular, Low Sodium diet. She is not exercising. She does not smoke.  Use of agents associated with hypertension: none.   Outside blood pressures are arranging around 120's/70's-80's. Symptoms: No chest pain No chest pressure  No palpitations No syncope  No dyspnea No orthopnea  No paroxysmal nocturnal dyspnea No lower extremity edema   Pertinent labs: Lab Results  Component Value Date   CHOL 195 01/11/2020   HDL 69 01/11/2020   LDLCALC 98 01/11/2020   TRIG 165 (H) 01/11/2020   CHOLHDL 2.8 01/11/2020   Lab Results    Component Value Date   NA 141 01/11/2020   K 3.6 01/11/2020   CREATININE 0.66 01/11/2020   GFRNONAA 95 01/11/2020   GFRAA 109 01/11/2020   GLUCOSE 97 01/11/2020     The 10-year ASCVD risk score Mikey Bussing DC Jr., et al., 2013) is: 4.7%   Lipid/Cholesterol, Follow-up  Last lipid panel Other pertinent labs  Lab Results  Component Value Date   CHOL 195 01/11/2020   HDL 69 01/11/2020   LDLCALC 98 01/11/2020   TRIG 165 (H) 01/11/2020   CHOLHDL 2.8 01/11/2020   Lab Results  Component Value Date   ALT 39 (H) 01/11/2020   AST 23 01/11/2020   PLT 250 01/11/2020   TSH 1.380 01/11/2020     She was last seen for this 2 months ago.  Management since that visit includes continue statin.  She reports excellent compliance with treatment. She is not having side effects.   Symptoms: No chest pain No chest pressure/discomfort  No dyspnea No lower extremity edema  No numbness or tingling of extremity No orthopnea  No palpitations No paroxysmal nocturnal dyspnea  No speech difficulty No syncope   Current diet: in general, a "healthy" diet   Current exercise: aerobics   The 10-year ASCVD risk score Mikey Bussing DC Jr., et al., 2013) is: 4.7%  ---------------------------------------------------------------------------------------------------  Due for cologuard, reports the kit never came to her house, last ordered 10/2019.  ---------------------------------------------------------------------------------------------------   Past Medical History:  Diagnosis Date  . Hypertension  Past Surgical History:  Procedure Laterality Date  . BREAST BIOPSY Right 2007   benign   Social History   Socioeconomic History  . Marital status: Single    Spouse name: Not on file  . Number of children: Not on file  . Years of education: Not on file  . Highest education level: Not on file  Occupational History  . Not on file  Tobacco Use  . Smoking status: Never Smoker  . Smokeless tobacco: Never  Used  Vaping Use  . Vaping Use: Never used  Substance and Sexual Activity  . Alcohol use: Yes    Alcohol/week: 6.0 - 9.0 standard drinks    Types: 3 Glasses of wine, 3 - 6 Cans of beer per week    Comment: social drinker  . Drug use: No  . Sexual activity: Not on file  Other Topics Concern  . Not on file  Social History Narrative  . Not on file   Social Determinants of Health   Financial Resource Strain:   . Difficulty of Paying Living Expenses: Not on file  Food Insecurity:   . Worried About Charity fundraiser in the Last Year: Not on file  . Ran Out of Food in the Last Year: Not on file  Transportation Needs:   . Lack of Transportation (Medical): Not on file  . Lack of Transportation (Non-Medical): Not on file  Physical Activity:   . Days of Exercise per Week: Not on file  . Minutes of Exercise per Session: Not on file  Stress:   . Feeling of Stress : Not on file  Social Connections:   . Frequency of Communication with Friends and Family: Not on file  . Frequency of Social Gatherings with Friends and Family: Not on file  . Attends Religious Services: Not on file  . Active Member of Clubs or Organizations: Not on file  . Attends Archivist Meetings: Not on file  . Marital Status: Not on file  Intimate Partner Violence:   . Fear of Current or Ex-Partner: Not on file  . Emotionally Abused: Not on file  . Physically Abused: Not on file  . Sexually Abused: Not on file   Family Status  Relation Name Status  . Mother  Deceased  . Father  Deceased  . Brother  Alive  . Brother  Interior and spatial designer  . Cousin  (Not Specified)   Family History  Problem Relation Age of Onset  . Lung disease Mother   . Heart disease Mother   . Heart disease Father   . Hyperlipidemia Father   . Heart attack Father 73  . Heart disease Brother   . Healthy Brother   . Pancreatic cancer Cousin    Allergies  Allergen Reactions  . Other     Adhesive tape    Patient Care  Team: Paulene Floor as PCP - General (Physician Assistant)   Medications: Outpatient Medications Prior to Visit  Medication Sig  . aspirin EC 81 MG tablet Take 81 mg by mouth daily.  . Cyanocobalamin (VITAMIN B 12 PO) Take by mouth.  . Multiple Vitamin (MULTI-VITAMINS) TABS Take 1 tablet by mouth daily.  Marland Kitchen omeprazole (PRILOSEC) 20 MG capsule TAKE 1 BY MOUTH EVERYDAY.  . [DISCONTINUED] atorvastatin (LIPITOR) 10 MG tablet Take one tablet every other night.  . [DISCONTINUED] irbesartan-hydrochlorothiazide (AVALIDE) 300-12.5 MG tablet Take 1 tablet by mouth daily.   No facility-administered medications prior to  visit.    Review of Systems    Objective    BP 128/90 (BP Location: Left Arm, Patient Position: Sitting, Cuff Size: Large)   Pulse 71   Temp 98.6 F (37 C) (Oral)   Ht $R'5\' 3"'aa$  (1.6 m)   Wt 152 lb 6.4 oz (69.1 kg)   SpO2 99%   BMI 27.00 kg/m    Physical Exam Constitutional:      Appearance: Normal appearance.  HENT:     Right Ear: Tympanic membrane, ear canal and external ear normal.     Left Ear: Tympanic membrane, ear canal and external ear normal.  Cardiovascular:     Rate and Rhythm: Normal rate and regular rhythm.     Pulses: Normal pulses.     Heart sounds: Normal heart sounds.  Pulmonary:     Effort: Pulmonary effort is normal.     Breath sounds: Normal breath sounds.  Abdominal:     General: Abdomen is flat. Bowel sounds are normal.     Palpations: Abdomen is soft.  Skin:    General: Skin is warm and dry.  Neurological:     General: No focal deficit present.     Mental Status: She is alert and oriented to person, place, and time.  Psychiatric:        Mood and Affect: Mood normal.        Behavior: Behavior normal.       Last depression screening scores PHQ 2/9 Scores 01/11/2020 09/21/2017 09/16/2016  PHQ - 2 Score 0 0 0  PHQ- 9 Score 0 0 1   Last fall risk screening Fall Risk  01/11/2020  Falls in the past year? 0  Number falls in  past yr: 0  Injury with Fall? 0  Risk for fall due to : No Fall Risks  Follow up Falls evaluation completed   Last Audit-C alcohol use screening Alcohol Use Disorder Test (AUDIT) 01/11/2020  1. How often do you have a drink containing alcohol? 3  2. How many drinks containing alcohol do you have on a typical day when you are drinking? 1  3. How often do you have six or more drinks on one occasion? 1  AUDIT-C Score 5  4. How often during the last year have you found that you were not able to stop drinking once you had started? 0  5. How often during the last year have you failed to do what was normally expected from you because of drinking? 0  6. How often during the last year have you needed a first drink in the morning to get yourself going after a heavy drinking session? 0  7. How often during the last year have you had a feeling of guilt of remorse after drinking? 0  8. How often during the last year have you been unable to remember what happened the night before because you had been drinking? 0  9. Have you or someone else been injured as a result of your drinking? 0  10. Has a relative or friend or a doctor or another health worker been concerned about your drinking or suggested you cut down? 0  Alcohol Use Disorder Identification Test Final Score (AUDIT) 5   A score of 3 or more in women, and 4 or more in men indicates increased risk for alcohol abuse, EXCEPT if all of the points are from question 1   Results for orders placed or performed in visit on 01/11/20  TSH  Result Value  Ref Range   TSH 1.380 0.450 - 4.500 uIU/mL  Lipid panel  Result Value Ref Range   Cholesterol, Total 195 100 - 199 mg/dL   Triglycerides 165 (H) 0 - 149 mg/dL   HDL 69 >39 mg/dL   VLDL Cholesterol Cal 28 5 - 40 mg/dL   LDL Chol Calc (NIH) 98 0 - 99 mg/dL   Chol/HDL Ratio 2.8 0.0 - 4.4 ratio  Comprehensive metabolic panel  Result Value Ref Range   Glucose 97 65 - 99 mg/dL   BUN 13 8 - 27 mg/dL    Creatinine, Ser 0.66 0.57 - 1.00 mg/dL   GFR calc non Af Amer 95 >59 mL/min/1.73   GFR calc Af Amer 109 >59 mL/min/1.73   BUN/Creatinine Ratio 20 12 - 28   Sodium 141 134 - 144 mmol/L   Potassium 3.6 3.5 - 5.2 mmol/L   Chloride 99 96 - 106 mmol/L   CO2 28 20 - 29 mmol/L   Calcium 9.1 8.7 - 10.3 mg/dL   Total Protein 6.5 6.0 - 8.5 g/dL   Albumin 4.6 3.8 - 4.8 g/dL   Globulin, Total 1.9 1.5 - 4.5 g/dL   Albumin/Globulin Ratio 2.4 (H) 1.2 - 2.2   Bilirubin Total 0.4 0.0 - 1.2 mg/dL   Alkaline Phosphatase 73 44 - 121 IU/L   AST 23 0 - 40 IU/L   ALT 39 (H) 0 - 32 IU/L  CBC with Differential/Platelet  Result Value Ref Range   WBC 6.5 3.4 - 10.8 x10E3/uL   RBC 4.92 3.77 - 5.28 x10E6/uL   Hemoglobin 15.2 11.1 - 15.9 g/dL   Hematocrit 43.9 34.0 - 46.6 %   MCV 89 79 - 97 fL   MCH 30.9 26.6 - 33.0 pg   MCHC 34.6 31 - 35 g/dL   RDW 12.5 11.7 - 15.4 %   Platelets 250 150 - 450 x10E3/uL   Neutrophils 62 Not Estab. %   Lymphs 26 Not Estab. %   Monocytes 6 Not Estab. %   Eos 5 Not Estab. %   Basos 1 Not Estab. %   Neutrophils Absolute 4.0 1.40 - 7.00 x10E3/uL   Lymphocytes Absolute 1.7 0 - 3 x10E3/uL   Monocytes Absolute 0.4 0 - 0 x10E3/uL   EOS (ABSOLUTE) 0.3 0.0 - 0.4 x10E3/uL   Basophils Absolute 0.1 0 - 0 x10E3/uL   Immature Granulocytes 0 Not Estab. %   Immature Grans (Abs) 0.0 0.0 - 0.1 x10E3/uL    Assessment & Plan    Routine Health Maintenance and Physical Exam  Exercise Activities and Dietary recommendations Goals   None     Immunization History  Administered Date(s) Administered  . Influenza,inj,Quad PF,6+ Mos 12/02/2018, 01/11/2020  . Tdap 05/14/2011    Health Maintenance  Topic Date Due  . Fecal DNA (Cologuard)  10/02/2019  . COVID-19 Vaccine (1) 01/27/2020 (Originally 03/22/1967)  . MAMMOGRAM  09/11/2020  . PAP SMEAR-Modifier  04/03/2021  . TETANUS/TDAP  05/13/2021  . INFLUENZA VACCINE  Completed  . Hepatitis C Screening  Completed  . HIV Screening   Completed    Discussed health benefits of physical activity, and encouraged her to engage in regular exercise appropriate for her age and condition.  1. Annual physical exam   2. Essential hypertension  Well controlled Continue current medications Recheck metabolic panel F/u in 6 months   3. Hypercholesteremia  Previously well controlled Continue statin Repeat FLP and CMP Goal LDL < 100   4. Colon cancer screening  Reorder cologuard.  5. Need for influenza vaccination  Flu shot updated today.    Return in about 6 months (around 07/10/2020) for HTN .     ITrinna Post, PA-C, have reviewed all documentation for this visit. The documentation on 01/12/20 for the exam, diagnosis, procedures, and orders are all accurate and complete.  The entirety of the information documented in the History of Present Illness, Review of Systems and Physical Exam were personally obtained by me. Portions of this information were initially documented by [facility] and reviewed by me for thoroughness and accuracy.     Paulene Floor  [facility] [phone] (phone) [phone] (fax)  [place]

## 2020-01-12 ENCOUNTER — Encounter: Payer: Self-pay | Admitting: Physician Assistant

## 2020-01-12 LAB — CBC WITH DIFFERENTIAL/PLATELET
Basophils Absolute: 0.1 10*3/uL (ref 0.0–0.2)
Basos: 1 %
EOS (ABSOLUTE): 0.3 10*3/uL (ref 0.0–0.4)
Eos: 5 %
Hematocrit: 43.9 % (ref 34.0–46.6)
Hemoglobin: 15.2 g/dL (ref 11.1–15.9)
Immature Grans (Abs): 0 10*3/uL (ref 0.0–0.1)
Immature Granulocytes: 0 %
Lymphocytes Absolute: 1.7 10*3/uL (ref 0.7–3.1)
Lymphs: 26 %
MCH: 30.9 pg (ref 26.6–33.0)
MCHC: 34.6 g/dL (ref 31.5–35.7)
MCV: 89 fL (ref 79–97)
Monocytes Absolute: 0.4 10*3/uL (ref 0.1–0.9)
Monocytes: 6 %
Neutrophils Absolute: 4 10*3/uL (ref 1.4–7.0)
Neutrophils: 62 %
Platelets: 250 10*3/uL (ref 150–450)
RBC: 4.92 x10E6/uL (ref 3.77–5.28)
RDW: 12.5 % (ref 11.7–15.4)
WBC: 6.5 10*3/uL (ref 3.4–10.8)

## 2020-01-12 LAB — COMPREHENSIVE METABOLIC PANEL
ALT: 39 IU/L — ABNORMAL HIGH (ref 0–32)
AST: 23 IU/L (ref 0–40)
Albumin/Globulin Ratio: 2.4 — ABNORMAL HIGH (ref 1.2–2.2)
Albumin: 4.6 g/dL (ref 3.8–4.8)
Alkaline Phosphatase: 73 IU/L (ref 44–121)
BUN/Creatinine Ratio: 20 (ref 12–28)
BUN: 13 mg/dL (ref 8–27)
Bilirubin Total: 0.4 mg/dL (ref 0.0–1.2)
CO2: 28 mmol/L (ref 20–29)
Calcium: 9.1 mg/dL (ref 8.7–10.3)
Chloride: 99 mmol/L (ref 96–106)
Creatinine, Ser: 0.66 mg/dL (ref 0.57–1.00)
GFR calc Af Amer: 109 mL/min/{1.73_m2} (ref >59)
GFR calc non Af Amer: 95 mL/min/{1.73_m2} (ref >59)
Globulin, Total: 1.9 g/dL (ref 1.5–4.5)
Glucose: 97 mg/dL (ref 65–99)
Potassium: 3.6 mmol/L (ref 3.5–5.2)
Sodium: 141 mmol/L (ref 134–144)
Total Protein: 6.5 g/dL (ref 6.0–8.5)

## 2020-01-12 LAB — LIPID PANEL
Chol/HDL Ratio: 2.8 ratio (ref 0.0–4.4)
Cholesterol, Total: 195 mg/dL (ref 100–199)
HDL: 69 mg/dL (ref >39)
LDL Chol Calc (NIH): 98 mg/dL (ref 0–99)
Triglycerides: 165 mg/dL — ABNORMAL HIGH (ref 0–149)
VLDL Cholesterol Cal: 28 mg/dL (ref 5–40)

## 2020-01-12 LAB — TSH: TSH: 1.38 u[IU]/mL (ref 0.450–4.500)

## 2020-01-13 ENCOUNTER — Encounter: Payer: Self-pay | Admitting: Physician Assistant

## 2020-01-20 DIAGNOSIS — Z1211 Encounter for screening for malignant neoplasm of colon: Secondary | ICD-10-CM | POA: Diagnosis not present

## 2020-01-30 LAB — COLOGUARD
COLOGUARD: NEGATIVE
Cologuard: NEGATIVE

## 2020-02-01 ENCOUNTER — Other Ambulatory Visit: Payer: Self-pay

## 2020-02-01 ENCOUNTER — Ambulatory Visit
Admission: RE | Admit: 2020-02-01 | Discharge: 2020-02-01 | Disposition: A | Discharge status: Home / Self Care | Payer: BC Managed Care – PPO | Source: Ambulatory Visit | Attending: Physician Assistant | Admitting: Physician Assistant

## 2020-02-01 DIAGNOSIS — Z1231 Encounter for screening mammogram for malignant neoplasm of breast: Secondary | ICD-10-CM | POA: Diagnosis not present

## 2020-02-09 ENCOUNTER — Telehealth: Payer: Self-pay | Admitting: Physician Assistant

## 2020-02-09 NOTE — Telephone Encounter (Signed)
[facility] faxed refill request for the following medications:  atorvastatin (LIPITOR) 10 MG tablet omeprazole (PRILOSEC) 20 MG capsule irbesartan-hydrochlorothiazide (AVALIDE) 300-12.5 MG tablet  90 day supply  Last Rx: 12/2019 LOV: 01/11/2020 Please advise. Thanks TNP

## 2020-02-09 NOTE — Telephone Encounter (Signed)
Medication was send into another pharmacy per patients request.

## 2020-02-26 ENCOUNTER — Other Ambulatory Visit: Payer: Self-pay | Admitting: Physician Assistant

## 2020-02-26 DIAGNOSIS — E7800 Pure hypercholesterolemia, unspecified: Secondary | ICD-10-CM

## 2020-02-26 DIAGNOSIS — I10 Essential (primary) hypertension: Secondary | ICD-10-CM

## 2020-02-26 NOTE — Telephone Encounter (Signed)
[facility] faxed refill request for the following medications:  irbesartan-hydrochlorothiazide (AVALIDE) 300-12.5 MG tablet  omeprazole (PRILOSEC) 20 MG capsule  atorvastatin (LIPITOR) 10 MG tablet   Please advise.

## 2020-02-27 NOTE — Telephone Encounter (Signed)
Please review. Thanks!  

## 2020-02-29 MED ORDER — IRBESARTAN-HYDROCHLOROTHIAZIDE 300-12.5 MG PO TABS: 1.0000 | ORAL_TABLET | Freq: Every day | ORAL | 1 refills | Status: DC

## 2020-02-29 MED ORDER — OMEPRAZOLE 20 MG PO CPDR: 20.0000 mg | DELAYED_RELEASE_CAPSULE | Freq: Every day | ORAL | 1 refills | Status: DC

## 2020-02-29 MED ORDER — ATORVASTATIN CALCIUM 10 MG PO TABS: ORAL_TABLET | ORAL | 1 refills | Status: DC

## 2020-03-03 ENCOUNTER — Other Ambulatory Visit: Payer: Self-pay | Admitting: Physician Assistant

## 2020-04-19 ENCOUNTER — Encounter: Payer: Self-pay | Admitting: Physician Assistant

## 2020-04-19 ENCOUNTER — Telehealth (INDEPENDENT_AMBULATORY_CARE_PROVIDER_SITE_OTHER): Payer: BC Managed Care – PPO | Admitting: Physician Assistant

## 2020-04-19 DIAGNOSIS — R21 Rash and other nonspecific skin eruption: Secondary | ICD-10-CM

## 2020-04-19 MED ORDER — PREDNISONE 10 MG PO TABS: ORAL_TABLET | ORAL | 0 refills | Status: DC

## 2020-04-19 NOTE — Progress Notes (Signed)
MyChart Video Visit    Virtual Visit via Video Note   This visit type was conducted due to national recommendations for restrictions regarding the COVID-19 Pandemic (e.g. social distancing) in an effort to limit this patient's exposure and mitigate transmission in our community. This patient is at least at moderate risk for complications without adequate follow up. This format is felt to be most appropriate for this patient at this time. Physical exam was limited by quality of the video and audio technology used for the visit.   Patient location: Home Provider location: Office   I discussed the limitations of evaluation and management by telemedicine and the availability of in person appointments. The patient expressed understanding and agreed to proceed.  Patient: Heather Russell   DOB: 09-Jan-1956   65 y.o. Female  MRN: [id number] Visit Date: 04/19/2020  Today's healthcare provider: Trey Sailors, PA-C   Chief Complaint  Patient presents with  . Rash  I,[name],acting as a scribe for Trey Sailors, PA-C.,have documented all relevant documentation on the behalf of Trey Sailors, PA-C,as directed by  Trey Sailors, PA-C while in the presence of Trey Sailors, PA-C.   Subjective    Rash This is a recurrent problem. The current episode started 1 to 4 weeks ago. The affected locations include the abdomen, left arm, right arm, back, chest, left fingers, left hand, right hand, right fingers, left upper leg and right upper leg. The rash is characterized by redness and itchiness. She was exposed to nothing. Pertinent negatives include no congestion, cough, diarrhea, eye pain, facial edema, fever, joint pain, nail changes, rhinorrhea, shortness of breath or vomiting. Past treatments include antihistamine and anti-itch cream. The treatment provided mild relief.    Patient reports rash began under her breasts several days ago. She used some cortisone on this rash  which then went away but then came back across her torso and her armpits. She also reports some hives. She reports the rash is itchy. She also reports the rash has spread to her legs and knees. Reports hands are also affected. Used a new soap.     Medications: Outpatient Medications Prior to Visit  Medication Sig  . aspirin EC 81 MG tablet Take 81 mg by mouth daily.  Marland Kitchen atorvastatin (LIPITOR) 10 MG tablet Take one tablet every other night.  . Cyanocobalamin (VITAMIN B 12 PO) Take by mouth.  . irbesartan-hydrochlorothiazide (AVALIDE) 300-12.5 MG tablet Take 1 tablet by mouth daily.  . Multiple Vitamin (MULTI-VITAMINS) TABS Take 1 tablet by mouth daily.  Marland Kitchen omeprazole (PRILOSEC) 20 MG capsule Take 1 capsule (20 mg total) by mouth daily.   No facility-administered medications prior to visit.    Review of Systems  Constitutional: Negative for fever.  HENT: Negative for congestion and rhinorrhea.   Eyes: Negative for pain.  Respiratory: Negative for cough and shortness of breath.   Gastrointestinal: Negative for diarrhea and vomiting.  Musculoskeletal: Negative for joint pain.  Skin: Positive for rash. Negative for nail changes.      Objective    There were no vitals taken for this visit.   Physical Exam Pulmonary:     Effort: Pulmonary effort is normal. No respiratory distress.  Skin:    General: Skin is warm and dry.     Findings: Erythema and rash present.       Media Information         Document Information  Patient Upload:  Patient Entered Attachment  [id number]  04/19/2020  Attached To:  Patient Message on 04/19/20 with Trey Sailors, PA-C   Source Information  Mychart, Generic     Assessment & Plan    1. Rash  - predniSONE (DELTASONE) 10 MG tablet; Take 6 pills on day 1, 5 pills on day 2 and so on until complete.  Dispense: 21 tablet; Refill: 0   No follow-ups on file.     I discussed the assessment and  treatment plan with the patient. The patient was provided an opportunity to ask questions and all were answered. The patient agreed with the plan and demonstrated an understanding of the instructions.   The patient was advised to call back or seek an in-person evaluation if the symptoms worsen or if the condition fails to improve as anticipated.   ITrey Sailors, PA-C, have reviewed all documentation for this visit. The documentation on 04/19/20 for the exam, diagnosis, procedures, and orders are all accurate and complete.  The entirety of the information documented in the History of Present Illness, Review of Systems and Physical Exam were personally obtained by me. Portions of this information were initially documented by [facility] and reviewed by me for thoroughness and accuracy.    Maryella Shivers [facility] [phone] (phone) [phone] (fax)  [facility]

## 2020-05-06 ENCOUNTER — Other Ambulatory Visit: Payer: Self-pay | Admitting: Physician Assistant

## 2020-05-27 ENCOUNTER — Telehealth: Payer: Self-pay

## 2020-05-27 DIAGNOSIS — I10 Essential (primary) hypertension: Secondary | ICD-10-CM

## 2020-05-27 MED ORDER — IRBESARTAN-HYDROCHLOROTHIAZIDE 300-12.5 MG PO TABS: 1.0000 | ORAL_TABLET | Freq: Every day | ORAL | 0 refills | Status: DC

## 2020-05-27 NOTE — Telephone Encounter (Signed)
[facility] faxed refill request for the following medications:  irbesartan-hydrochlorothiazide (AVALIDE) 300-12.5 MG tablet   Please advise.

## 2020-07-10 ENCOUNTER — Ambulatory Visit: Payer: Self-pay | Admitting: Physician Assistant

## 2020-08-08 ENCOUNTER — Telehealth: Payer: Self-pay

## 2020-08-08 DIAGNOSIS — I10 Essential (primary) hypertension: Secondary | ICD-10-CM

## 2020-08-08 MED ORDER — IRBESARTAN-HYDROCHLOROTHIAZIDE 300-12.5 MG PO TABS: 1.0000 | ORAL_TABLET | Freq: Every day | ORAL | 0 refills | Status: DC

## 2020-08-08 NOTE — Telephone Encounter (Signed)
Done

## 2020-08-08 NOTE — Telephone Encounter (Signed)
[facility] faxed refill request for the following medications:  irbesartan-hydrochlorothiazide (AVALIDE) 300-12.5 MG tablet   Please advise.

## 2020-09-01 ENCOUNTER — Encounter: Payer: Self-pay | Admitting: Family Medicine

## 2020-09-02 MED ORDER — OMEPRAZOLE 20 MG PO CPDR
20.0000 mg | DELAYED_RELEASE_CAPSULE | Freq: Every day | ORAL | 0 refills | Status: DC
Start: 2020-09-02 — End: 2020-11-25

## 2020-10-15 ENCOUNTER — Encounter: Payer: Self-pay | Admitting: Family Medicine

## 2020-10-15 ENCOUNTER — Ambulatory Visit (INDEPENDENT_AMBULATORY_CARE_PROVIDER_SITE_OTHER): Payer: BC Managed Care – PPO | Admitting: Family Medicine

## 2020-10-15 ENCOUNTER — Other Ambulatory Visit: Payer: Self-pay

## 2020-10-15 ENCOUNTER — Ambulatory Visit: Payer: BC Managed Care – PPO | Admitting: Family Medicine

## 2020-10-15 VITALS — BP 126/86 | HR 66 | Temp 98.6°F | Resp 16 | Ht 63.0 in | Wt 151.4 lb

## 2020-10-15 DIAGNOSIS — I10 Essential (primary) hypertension: Secondary | ICD-10-CM

## 2020-10-15 DIAGNOSIS — E7800 Pure hypercholesterolemia, unspecified: Secondary | ICD-10-CM | POA: Diagnosis not present

## 2020-10-15 DIAGNOSIS — K219 Gastro-esophageal reflux disease without esophagitis: Secondary | ICD-10-CM

## 2020-10-15 MED ORDER — IRBESARTAN-HYDROCHLOROTHIAZIDE 300-12.5 MG PO TABS: 1.0000 | ORAL_TABLET | Freq: Every day | ORAL | 3 refills | Status: DC

## 2020-10-15 MED ORDER — ATORVASTATIN CALCIUM 10 MG PO TABS: ORAL_TABLET | ORAL | 3 refills | Status: DC

## 2020-10-15 NOTE — Assessment & Plan Note (Signed)
Previously well controlled Continue statin qod Repeat FLP and CMP Goal LDL < 100

## 2020-10-15 NOTE — Progress Notes (Signed)
Established patient visit   Patient: Heather Russell   DOB: October 08, 1955   65 y.o. Female  MRN: [id number] Visit Date: 10/15/2020  Today's healthcare provider: Shirlee Latch, MD   Chief Complaint  Patient presents with   Hypertension   Hyperlipidemia   Subjective  -------------------------------------------------------------------------------------------------------------------- Hypertension Pertinent negatives include no chest pain, headaches, neck pain, palpitations or shortness of breath.  Hyperlipidemia Pertinent negatives include no chest pain, myalgias or shortness of breath.   Hypertension, follow-up  BP Readings from Last 3 Encounters:  10/15/20 126/86  01/11/20 128/90  11/01/19 (!) 147/86   Wt Readings from Last 3 Encounters:  10/15/20 151 lb 6.4 oz (68.7 kg)  01/11/20 152 lb 6.4 oz (69.1 kg)  11/01/19 152 lb 9.6 oz (69.2 kg)     She was last seen for hypertension 9 months ago.  BP at that visit was 128/90 . Management since that visit includes no changes.  Tolerating HCTZ.   She reports she has stopped taking the aspirin because it was causing bruising.   She reports excellent compliance with treatment. She is not having side effects.  She is following a Low Sodium diet. She is not exercising. She does not smoke.  Use of agents associated with hypertension: none.   Outside blood pressures are stable.  Symptoms: No chest pain No chest pressure  No palpitations No syncope  No dyspnea No orthopnea  No paroxysmal nocturnal dyspnea Yes lower extremity edema   Pertinent labs: Lab Results  Component Value Date   CHOL 195 01/11/2020   HDL 69 01/11/2020   LDLCALC 98 01/11/2020   TRIG 165 (H) 01/11/2020   CHOLHDL 2.8 01/11/2020   Lab Results  Component Value Date   NA 141 01/11/2020   K 3.6 01/11/2020   CREATININE 0.66 01/11/2020   GFRNONAA 95 01/11/2020   GFRAA 109 01/11/2020   GLUCOSE 97 01/11/2020     The 10-year ASCVD risk score  Denman George DC Jr., et al., 2013) is: 5.1%   ---------------------------------------------------------------------------------------------------  Lipid/Cholesterol, Follow-up  Last lipid panel Other pertinent labs  Lab Results  Component Value Date   CHOL 195 01/11/2020   HDL 69 01/11/2020   LDLCALC 98 01/11/2020   TRIG 165 (H) 01/11/2020   CHOLHDL 2.8 01/11/2020   Lab Results  Component Value Date   ALT 39 (H) 01/11/2020   AST 23 01/11/2020   PLT 250 01/11/2020   TSH 1.380 01/11/2020     She was last seen for this 6 months ago.  Management since that visit includes no changes.  Tolerating 10 mg atorvastatin PO every other day.   She reports excellent compliance with treatment. She is not having side effects.   Symptoms: No chest pain No chest pressure/discomfort  No dyspnea Yes lower extremity edema  No numbness or tingling of extremity No orthopnea  No palpitations No paroxysmal nocturnal dyspnea  No speech difficulty No syncope   Current diet: in general, a "healthy" diet   Current exercise: none  GERD She is taking 20 mg omeprazole for reflux. She reports being sensitive to citrus. However her reflux is well controlled on the medication.  The 10-year ASCVD risk score Denman George DC Jr., et al., 2013) is: 5.1%  ---------------------------------------------------------------------------------------------------   Patient Active Problem List   Diagnosis Date Noted   GERD (gastroesophageal reflux disease) 10/15/2020   Hypercholesteremia 09/17/2016   Essential hypertension 09/17/2016   Social History   Tobacco Use   Smoking status: Never   Smokeless tobacco:  Never  Vaping Use   Vaping Use: Never used  Substance Use Topics   Alcohol use: Yes    Alcohol/week: 6.0 - 9.0 standard drinks    Types: 3 Glasses of wine, 3 - 6 Cans of beer per week    Comment: social drinker   Drug use: No   Allergies  Allergen Reactions   Other     Adhesive tape        Medications: Outpatient Medications Prior to Visit  Medication Sig   Multiple Vitamin (MULTI-VITAMINS) TABS Take 1 tablet by mouth daily.   omeprazole (PRILOSEC) 20 MG capsule Take 1 capsule (20 mg total) by mouth daily.   [DISCONTINUED] aspirin EC 81 MG tablet Take 81 mg by mouth every other day.   [DISCONTINUED] atorvastatin (LIPITOR) 10 MG tablet Take one tablet every other night.   [DISCONTINUED] Cyanocobalamin (VITAMIN B 12 PO) Take by mouth.   [DISCONTINUED] irbesartan-hydrochlorothiazide (AVALIDE) 300-12.5 MG tablet Take 1 tablet by mouth daily. Please schedule an office visit before anymore refills.   [DISCONTINUED] predniSONE (DELTASONE) 10 MG tablet Take 6 pills on day 1, 5 pills on day 2 and so on until complete.   No facility-administered medications prior to visit.    Review of Systems  Constitutional:  Negative for activity change, appetite change, chills, fatigue and fever.  HENT:  Negative for ear pain, sinus pressure, sinus pain and sore throat.   Eyes:  Negative for pain and visual disturbance.  Respiratory:  Negative for cough, chest tightness, shortness of breath and wheezing.   Cardiovascular:  Negative for chest pain, palpitations and leg swelling.  Gastrointestinal:  Negative for abdominal pain, blood in stool, diarrhea, nausea and vomiting.  Genitourinary:  Negative for flank pain, frequency, pelvic pain and urgency.  Musculoskeletal:  Negative for back pain, myalgias and neck pain.  Neurological:  Negative for dizziness, weakness, light-headedness, numbness and headaches.       Objective  -------------------------------------------------------------------------------------------------------------------- BP 126/86 (BP Location: Left Arm, Patient Position: Sitting, Cuff Size: Normal)   Pulse 66   Temp 98.6 F (37 C) (Oral)   Resp 16   Ht 5\' 3"  (1.6 m)   Wt 151 lb 6.4 oz (68.7 kg)   BMI 26.82 kg/m  BP Readings from Last 3 Encounters:  10/15/20 126/86   01/11/20 128/90  11/01/19 (!) 147/86   Wt Readings from Last 3 Encounters:  10/15/20 151 lb 6.4 oz (68.7 kg)  01/11/20 152 lb 6.4 oz (69.1 kg)  11/01/19 152 lb 9.6 oz (69.2 kg)       Physical Exam Vitals reviewed.  Constitutional:      General: She is not in acute distress.    Appearance: Normal appearance. She is well-developed. She is not diaphoretic.  HENT:     Head: Normocephalic and atraumatic.  Eyes:     General: No scleral icterus.    Conjunctiva/sclera: Conjunctivae normal.  Neck:     Thyroid: No thyromegaly.  Cardiovascular:     Rate and Rhythm: Normal rate and regular rhythm.     Pulses: Normal pulses.     Heart sounds: Normal heart sounds. No murmur heard. Pulmonary:     Effort: Pulmonary effort is normal. No respiratory distress.     Breath sounds: Normal breath sounds. No wheezing, rhonchi or rales.  Musculoskeletal:     Cervical back: Neck supple.     Right lower leg: No edema.     Left lower leg: No edema.  Lymphadenopathy:     Cervical:  No cervical adenopathy.  Skin:    General: Skin is warm and dry.     Findings: No rash.  Neurological:     Mental Status: She is alert and oriented to person, place, and time. Mental status is at baseline.  Psychiatric:        Mood and Affect: Mood normal.        Behavior: Behavior normal.     No results found for any visits on 10/15/20.  Assessment & Plan  ---------------------------------------------------------------------------------------------------------------------- Problem List Items Addressed This Visit       Cardiovascular and Mediastinum   Essential hypertension - Primary    Well controlled Continue current medications Recheck metabolic panel F/u in 6 months       Relevant Medications   irbesartan-hydrochlorothiazide (AVALIDE) 300-12.5 MG tablet   atorvastatin (LIPITOR) 10 MG tablet   Other Relevant Orders   Comprehensive metabolic panel     Digestive   GERD (gastroesophageal reflux  disease)    Well controlled Continue PPI      Relevant Orders   CBC     Other   Hypercholesteremia    Previously well controlled Continue statin qod Repeat FLP and CMP Goal LDL < 100      Relevant Medications   irbesartan-hydrochlorothiazide (AVALIDE) 300-12.5 MG tablet   atorvastatin (LIPITOR) 10 MG tablet   Other Relevant Orders   Lipid panel   Comprehensive metabolic panel    Return in about 6 months (around 04/17/2021) for CPE.      I,Essence Turner,acting as a Neurosurgeon for Shirlee Latch, MD.,have documented all relevant documentation on the behalf of Shirlee Latch, MD,as directed by  Shirlee Latch, MD while in the presence of Shirlee Latch, MD.  I, Shirlee Latch, MD, have reviewed all documentation for this visit. The documentation on 10/15/20 for the exam, diagnosis, procedures, and orders are all accurate and complete.   Aolanis Crispen, Marzella Schlein, MD, MPH [facility]

## 2020-10-15 NOTE — Assessment & Plan Note (Signed)
Well controlled Continue current medications Recheck metabolic panel F/u in 6 months  

## 2020-10-15 NOTE — Assessment & Plan Note (Signed)
Well controlled Continue PPI 

## 2020-10-16 ENCOUNTER — Encounter: Payer: Self-pay | Admitting: Family Medicine

## 2020-10-16 LAB — CBC
Hematocrit: 45.1 % (ref 34.0–46.6)
Hemoglobin: 15.1 g/dL (ref 11.1–15.9)
MCH: 30.6 pg (ref 26.6–33.0)
MCHC: 33.5 g/dL (ref 31.5–35.7)
MCV: 92 fL (ref 79–97)
Platelets: 242 10*3/uL (ref 150–450)
RBC: 4.93 x10E6/uL (ref 3.77–5.28)
RDW: 13.1 % (ref 11.7–15.4)
WBC: 6 10*3/uL (ref 3.4–10.8)

## 2020-10-16 LAB — COMPREHENSIVE METABOLIC PANEL
ALT: 106 IU/L — ABNORMAL HIGH (ref 0–32)
AST: 45 IU/L — ABNORMAL HIGH (ref 0–40)
Albumin/Globulin Ratio: 2.2 (ref 1.2–2.2)
Albumin: 4.7 g/dL (ref 3.8–4.8)
Alkaline Phosphatase: 92 IU/L (ref 44–121)
BUN/Creatinine Ratio: 13 (ref 12–28)
BUN: 10 mg/dL (ref 8–27)
Bilirubin Total: 0.6 mg/dL (ref 0.0–1.2)
CO2: 26 mmol/L (ref 20–29)
Calcium: 9.7 mg/dL (ref 8.7–10.3)
Chloride: 97 mmol/L (ref 96–106)
Creatinine, Ser: 0.75 mg/dL (ref 0.57–1.00)
Globulin, Total: 2.1 g/dL (ref 1.5–4.5)
Glucose: 89 mg/dL (ref 65–99)
Potassium: 4.7 mmol/L (ref 3.5–5.2)
Sodium: 138 mmol/L (ref 134–144)
Total Protein: 6.8 g/dL (ref 6.0–8.5)
eGFR: 89 mL/min/{1.73_m2} (ref >59)

## 2020-10-16 LAB — LIPID PANEL
Chol/HDL Ratio: 2.6 ratio (ref 0.0–4.4)
Cholesterol, Total: 214 mg/dL — ABNORMAL HIGH (ref 100–199)
HDL: 81 mg/dL (ref >39)
LDL Chol Calc (NIH): 109 mg/dL — ABNORMAL HIGH (ref 0–99)
Triglycerides: 143 mg/dL (ref 0–149)
VLDL Cholesterol Cal: 24 mg/dL (ref 5–40)

## 2020-10-17 ENCOUNTER — Telehealth: Payer: Self-pay

## 2020-10-17 DIAGNOSIS — R748 Abnormal levels of other serum enzymes: Secondary | ICD-10-CM

## 2020-10-17 NOTE — Telephone Encounter (Signed)
-----   Message from Erasmo Downer, MD sent at 10/16/2020  4:37 PM EDT ----- Normal labs, except for elevated liver function tests.  Recommend decreasing tylenol and alcohol and repeat in 1 month.

## 2020-11-05 ENCOUNTER — Other Ambulatory Visit: Payer: Self-pay | Admitting: Family Medicine

## 2020-11-05 DIAGNOSIS — I10 Essential (primary) hypertension: Secondary | ICD-10-CM

## 2020-11-25 ENCOUNTER — Other Ambulatory Visit: Payer: Self-pay | Admitting: Family Medicine

## 2021-03-11 ENCOUNTER — Encounter: Payer: Self-pay | Admitting: Family Medicine

## 2021-03-11 ENCOUNTER — Other Ambulatory Visit: Payer: Self-pay

## 2021-03-11 ENCOUNTER — Ambulatory Visit (INDEPENDENT_AMBULATORY_CARE_PROVIDER_SITE_OTHER): Payer: BC Managed Care – PPO | Admitting: Family Medicine

## 2021-03-11 ENCOUNTER — Inpatient Hospital Stay (INDEPENDENT_AMBULATORY_CARE_PROVIDER_SITE_OTHER): Payer: BC Managed Care – PPO

## 2021-03-11 VITALS — BP 136/91 | HR 64 | Temp 97.8°F | Resp 16 | Wt 152.0 lb

## 2021-03-11 DIAGNOSIS — F439 Reaction to severe stress, unspecified: Secondary | ICD-10-CM

## 2021-03-11 DIAGNOSIS — R002 Palpitations: Secondary | ICD-10-CM

## 2021-03-11 MED ORDER — HYDROXYZINE HCL 10 MG PO TABS: 10.0000 mg | ORAL_TABLET | Freq: Three times a day (TID) | ORAL | 0 refills | Status: DC | PRN

## 2021-03-11 NOTE — Progress Notes (Signed)
Established patient visit   Patient: Heather Russell   DOB: August 25, 1955   66 y.o. Female  MRN: [id number] Visit Date: 03/11/2021  Today's healthcare provider: Lavon Paganini, MD   Chief Complaint  Patient presents with   Irregular Heart Beat   Subjective    HPI  - Has had several episodes over the past year of waking up at night with racing and irregular heart beat - Used K[employer]ia[employer] monitor at home which identified abnormal rhythm - possible a fib - Associated with not wearing CPAP at night and increased stress at work - Similar episodes when younger during times of stress  Medications: Outpatient Medications Prior to Visit  Medication Sig   atorvastatin (LIPITOR) 10 MG tablet Take one tablet every other night.   irbesartan-hydrochlorothiazide (AVALIDE) 300-12.5 MG tablet TAKE 1 TABLET DAILY   Multiple Vitamin (MULTI-VITAMINS) TABS Take 1 tablet by mouth daily.   omeprazole (PRILOSEC) 20 MG capsule TAKE 1 CAPSULE DAILY   No facility-administered medications prior to visit.    Review of Systems  Constitutional: Negative.   HENT: Negative.    Eyes: Negative.   Respiratory:  Negative for chest tightness and shortness of breath.   Cardiovascular:  Negative for chest pain.  Gastrointestinal: Negative.   Endocrine: Negative.   Genitourinary: Negative.   Musculoskeletal: Negative.   Skin: Negative.   Neurological: Negative.   Psychiatric/Behavioral:  Positive for sleep disturbance. The patient is nervous/anxious.       Objective    BP (!) 136/91 (BP Location: Left Arm, Patient Position: Sitting, Cuff Size: Large)    Pulse 64    Temp 97.8 F (36.6 C) (Temporal)    Resp 16    Wt 152 lb (68.9 kg)    SpO2 98%    BMI 26.93 kg/m  {Show previous vital signs (optional):23777}  Physical Exam Vitals reviewed.  Constitutional:      General: She is not in acute distress.    Appearance: Normal appearance. She is not ill-appearing or toxic-appearing.  HENT:      Head: Normocephalic and atraumatic.     Right Ear: External ear normal.     Left Ear: External ear normal.     Nose: Nose normal.     Mouth/Throat:     Mouth: Mucous membranes are moist.     Pharynx: Oropharynx is clear. No oropharyngeal exudate or posterior oropharyngeal erythema.  Eyes:     General: No scleral icterus.    Extraocular Movements: Extraocular movements intact.     Conjunctiva/sclera: Conjunctivae normal.     Pupils: Pupils are equal, round, and reactive to light.  Cardiovascular:     Rate and Rhythm: Normal rate and regular rhythm.     Pulses: Normal pulses.     Heart sounds: Normal heart sounds. No murmur heard.   No friction rub. No gallop.  Pulmonary:     Effort: Pulmonary effort is normal. No respiratory distress.     Breath sounds: Normal breath sounds. No wheezing or rhonchi.  Chest:     Chest wall: No tenderness.  Abdominal:     General: Abdomen is flat. There is no distension.     Palpations: Abdomen is soft.     Tenderness: There is no abdominal tenderness.  Musculoskeletal:        General: Normal range of motion.     Cervical back: Normal range of motion and neck supple.     Right lower leg: No edema.     Left  lower leg: No edema.  Skin:    General: Skin is warm and dry.     Capillary Refill: Capillary refill takes less than 2 seconds.     Findings: No lesion or rash.  Neurological:     General: No focal deficit present.     Mental Status: She is alert and oriented to person, place, and time. Mental status is at baseline.  Psychiatric:        Mood and Affect: Mood normal.     EKG: NSR  No results found for any visits on 03/11/21.  Assessment & Plan     Problem List Items Addressed This Visit   None Visit Diagnoses     Palpitations    -  Primary   Paroxysmal palpitations, possibly Afib vs SVT Associated with not wearing CPAP and increased stress CBC, TSH, CMP to rule out possible underlying etiologies Get Zio patch to confirm Further  workup pending Zio results   Relevant Orders   EKG 12-Lead (Completed)   LONG TERM MONITOR (3-14 DAYS)   CBC   TSH   Comprehensive metabolic panel   Stress       Related to work [employer] as needed for anxiety        Return for as scheduled.      Nelva Nay, Medical Student 03/11/2021, 9:22 AM    Patient seen along with MS3 student Nelva Nay. I personally evaluated this patient along with the student, and verified all aspects of the history, physical exam, and medical decision making as documented by the student. I agree with the student's documentation and have made all necessary edits.  Hero Mccathern, Dionne Bucy, MD, MPH [facility]

## 2021-03-12 LAB — CBC
Hematocrit: 43.2 % (ref 34.0–46.6)
Hemoglobin: 15.3 g/dL (ref 11.1–15.9)
MCH: 30.7 pg (ref 26.6–33.0)
MCHC: 35.4 g/dL (ref 31.5–35.7)
MCV: 87 fL (ref 79–97)
Platelets: 240 10*3/uL (ref 150–450)
RBC: 4.98 x10E6/uL (ref 3.77–5.28)
RDW: 12.8 % (ref 11.7–15.4)
WBC: 5.7 10*3/uL (ref 3.4–10.8)

## 2021-03-12 LAB — COMPREHENSIVE METABOLIC PANEL
ALT: 25 IU/L (ref 0–32)
AST: 17 IU/L (ref 0–40)
Albumin/Globulin Ratio: 2.8 — ABNORMAL HIGH (ref 1.2–2.2)
Albumin: 4.8 g/dL (ref 3.8–4.8)
Alkaline Phosphatase: 64 IU/L (ref 44–121)
BUN/Creatinine Ratio: 12 (ref 12–28)
BUN: 10 mg/dL (ref 8–27)
Bilirubin Total: 0.5 mg/dL (ref 0.0–1.2)
CO2: 27 mmol/L (ref 20–29)
Calcium: 9.6 mg/dL (ref 8.7–10.3)
Chloride: 99 mmol/L (ref 96–106)
Creatinine, Ser: 0.85 mg/dL (ref 0.57–1.00)
Globulin, Total: 1.7 g/dL (ref 1.5–4.5)
Glucose: 96 mg/dL (ref 70–99)
Potassium: 4 mmol/L (ref 3.5–5.2)
Sodium: 139 mmol/L (ref 134–144)
Total Protein: 6.5 g/dL (ref 6.0–8.5)
eGFR: 77 mL/min/{1.73_m2} (ref >59)

## 2021-03-12 LAB — TSH: TSH: 1.28 u[IU]/mL (ref 0.450–4.500)

## 2021-03-17 ENCOUNTER — Ambulatory Visit: Payer: BC Managed Care – PPO | Admitting: Family Medicine

## 2021-03-18 ENCOUNTER — Other Ambulatory Visit: Payer: Self-pay | Admitting: Family Medicine

## 2021-03-18 DIAGNOSIS — Z1231 Encounter for screening mammogram for malignant neoplasm of breast: Secondary | ICD-10-CM

## 2021-03-27 NOTE — Progress Notes (Signed)
Zio patch reviewed by Cardiology. No concerning arrhythmias. If bothersome, can use metoprolol prn.

## 2021-04-07 NOTE — Progress Notes (Unsigned)
Complete physical exam   Patient: Heather Russell   DOB: 1956-02-03   66 y.o. Female  MRN: [id number] Visit Date: 04/08/2021  Today's healthcare provider: Shirlee Latch, MD   No chief complaint on file.  Subjective    Heather Russell is a 66 y.o. female who presents today for a complete physical exam.  She reports consuming a {diet types:17450} diet. {Exercise:19826} She generally feels {well/fairly well/poorly:18703}. She reports sleeping {well/fairly well/poorly:18703}. She {does/does not:200015} have additional problems to discuss today.  HPI  Mammogram scheduled-04/18/2021  8:40 AM [facility] Cologuard: 01/20/20-Negative-repeat in 2024 Labs done 03/11/21  Past Medical History:  Diagnosis Date   Hypertension    Past Surgical History:  Procedure Laterality Date   BREAST BIOPSY Right 2007   benign   Social History   Socioeconomic History   Marital status: Single    Spouse name: Not on file   Number of children: Not on file   Years of education: Not on file   Highest education level: Not on file  Occupational History   Not on file  Tobacco Use   Smoking status: Never   Smokeless tobacco: Never  Vaping Use   Vaping Use: Never used  Substance and Sexual Activity   Alcohol use: Yes    Alcohol/week: 6.0 - 9.0 standard drinks    Types: 3 Glasses of wine, 3 - 6 Cans of beer per week    Comment: social drinker   Drug use: No   Sexual activity: Not on file  Other Topics Concern   Not on file  Social History Narrative   Not on file   Social Determinants of Health   Financial Resource Strain: Not on file  Food Insecurity: Not on file  Transportation Needs: Not on file  Physical Activity: Not on file  Stress: Not on file  Social Connections: Not on file  Intimate Partner Violence: Not on file   Family Status  Relation Name Status   Mother  Deceased   Father  Deceased   Brother  Alive   Brother  Alive        health   Cousin  (Not Specified)   Family History  Problem Relation Age of Onset   Lung disease Mother    Heart disease Mother    Heart disease Father    Hyperlipidemia Father    Heart attack Father 92   Heart disease Brother    Healthy Brother    Pancreatic cancer Cousin    Allergies  Allergen Reactions   Other     Adhesive tape    Patient Care Team: Erasmo Downer, MD as PCP - General (Family Medicine)   Medications: Outpatient Medications Prior to Visit  Medication Sig   atorvastatin (LIPITOR) 10 MG tablet Take one tablet every other night.   hydrOXYzine (ATARAX) 10 MG tablet Take 1 tablet (10 mg total) by mouth 3 (three) times daily as needed.   irbesartan-hydrochlorothiazide (AVALIDE) 300-12.5 MG tablet TAKE 1 TABLET DAILY   Multiple Vitamin (MULTI-VITAMINS) TABS Take 1 tablet by mouth daily.   omeprazole (PRILOSEC) 20 MG capsule TAKE 1 CAPSULE DAILY   No facility-administered medications prior to visit.    Review of Systems  {Labs   Heme   Chem   Endocrine   Serology   Results Review (optional):23779}  Objective    There were no vitals taken for this visit. {Show previous vital signs (optional):23777}  Physical Exam  ***  Last  depression screening scores PHQ 2/9 Scores 10/15/2020 01/11/2020 09/21/2017  PHQ - 2 Score 0 0 0  PHQ- 9 Score 0 0 0   Last fall risk screening Fall Risk  10/15/2020  Falls in the past year? 0  Number falls in past yr: 0  Injury with Fall? 0  Risk for fall due to : No Fall Risks  Follow up Falls evaluation completed   Last Audit-C alcohol use screening Alcohol Use Disorder Test (AUDIT) 10/15/2020  1. How often do you have a drink containing alcohol? 3  2. How many drinks containing alcohol do you have on a typical day when you are drinking? 1  3. How often do you have six or more drinks on one occasion? 1  AUDIT-C Score 5  4. How often during the last year have you found that you were not able to stop drinking once you had  started? 0  5. How often during the last year have you failed to do what was normally expected from you because of drinking? 0  6. How often during the last year have you needed a first drink in the morning to get yourself going after a heavy drinking session? 0  7. How often during the last year have you had a feeling of guilt of remorse after drinking? 0  8. How often during the last year have you been unable to remember what happened the night before because you had been drinking? 0  9. Have you or someone else been injured as a result of your drinking? 0  10. Has a relative or friend or a doctor or another health worker been concerned about your drinking or suggested you cut down? 0  Alcohol Use Disorder Identification Test Final Score (AUDIT) 5   A score of 3 or more in women, and 4 or more in men indicates increased risk for alcohol abuse, EXCEPT if all of the points are from question 1   No results found for any visits on 04/08/21.  Assessment & Plan    Routine Health Maintenance and Physical Exam  Exercise Activities and Dietary recommendations  Goals   None     Immunization History  Administered Date(s) Administered   Influenza,inj,Quad PF,6+ Mos 12/02/2018, 01/11/2020   Moderna Sars-Covid-2 Vaccination 05/01/2019, 05/29/2019, 12/29/2019   Tdap 05/14/2011    Health Maintenance  Topic Date Due   Zoster Vaccines- Shingrix (1 of 2) Never done   COVID-19 Vaccine (4 - Booster for Moderna series) 02/23/2020   PAP SMEAR-Modifier  04/03/2021   INFLUENZA VACCINE  05/23/2021 (Originally 09/23/2020)   TETANUS/TDAP  05/13/2021   MAMMOGRAM  01/31/2022   Fecal DNA (Cologuard)  01/30/2023   Hepatitis C Screening  Completed   HIV Screening  Completed   HPV VACCINES  Aged Out    Discussed health benefits of physical activity, and encouraged her to engage in regular exercise appropriate for her age and condition.  ***  No follow-ups on file.     {provider  attestation***:1}   Shirlee Latch, MD  [facility] [phone] (phone) [phone] (fax)  [facility]

## 2021-04-08 ENCOUNTER — Encounter: Payer: BC Managed Care – PPO | Admitting: Family Medicine

## 2021-04-09 ENCOUNTER — Encounter: Payer: BC Managed Care – PPO | Admitting: Family Medicine

## 2021-04-18 ENCOUNTER — Ambulatory Visit
Admission: RE | Admit: 2021-04-18 | Discharge: 2021-04-18 | Disposition: A | Discharge status: Home / Self Care | Payer: BC Managed Care – PPO | Source: Ambulatory Visit | Attending: Family Medicine | Admitting: Family Medicine

## 2021-04-18 ENCOUNTER — Other Ambulatory Visit: Payer: Self-pay | Admitting: Family Medicine

## 2021-04-18 ENCOUNTER — Other Ambulatory Visit: Payer: Self-pay

## 2021-04-18 DIAGNOSIS — N6315 Unspecified lump in the right breast, overlapping quadrants: Secondary | ICD-10-CM

## 2021-04-18 DIAGNOSIS — Z1231 Encounter for screening mammogram for malignant neoplasm of breast: Secondary | ICD-10-CM

## 2021-05-05 ENCOUNTER — Ambulatory Visit
Admission: RE | Admit: 2021-05-05 | Discharge: 2021-05-05 | Disposition: A | Discharge status: Home / Self Care | Payer: BC Managed Care – PPO | Source: Ambulatory Visit | Attending: Family Medicine | Admitting: Family Medicine

## 2021-05-05 ENCOUNTER — Other Ambulatory Visit: Payer: Self-pay | Admitting: Family Medicine

## 2021-05-05 ENCOUNTER — Other Ambulatory Visit: Payer: Self-pay

## 2021-05-05 DIAGNOSIS — N6315 Unspecified lump in the right breast, overlapping quadrants: Secondary | ICD-10-CM | POA: Diagnosis not present

## 2021-05-05 DIAGNOSIS — N6310 Unspecified lump in the right breast, unspecified quadrant: Secondary | ICD-10-CM | POA: Diagnosis not present

## 2021-05-05 DIAGNOSIS — I10 Essential (primary) hypertension: Secondary | ICD-10-CM

## 2021-05-05 DIAGNOSIS — Z1231 Encounter for screening mammogram for malignant neoplasm of breast: Secondary | ICD-10-CM

## 2021-05-05 DIAGNOSIS — R922 Inconclusive mammogram: Secondary | ICD-10-CM | POA: Diagnosis not present

## 2021-05-05 NOTE — Telephone Encounter (Signed)
Requested Prescriptions  ?Pending Prescriptions Disp Refills  ?? irbesartan-hydrochlorothiazide (AVALIDE) 300-12.5 MG tablet [Pharmacy Med Name: IRBESARTAN/HCTZ TABS 300/12.5MG] 90 tablet 3  ?  Sig: TAKE 1 TABLET DAILY  ?  ? Cardiovascular: ARB + Diuretic Combos Failed - 05/05/2021 12:08 AM  ?  ?  Failed - Last BP in normal range  ?  BP Readings from Last 1 Encounters:  ?03/11/21 (!) 136/91  ?   ?  ?  Passed - K in normal range and within 180 days  ?  Potassium  ?Date Value Ref Range Status  ?03/11/2021 4.0 3.5 - 5.2 mmol/L Final  ?   ?  ?  Passed - Na in normal range and within 180 days  ?  Sodium  ?Date Value Ref Range Status  ?03/11/2021 139 134 - 144 mmol/L Final  ?   ?  ?  Passed - Cr in normal range and within 180 days  ?  Creatinine, Ser  ?Date Value Ref Range Status  ?03/11/2021 0.85 0.57 - 1.00 mg/dL Final  ?   ?  ?  Passed - eGFR is 10 or above and within 180 days  ?  GFR calc Af Amer  ?Date Value Ref Range Status  ?01/11/2020 109 >59 mL/min/1.73 Final  ?  Comment:  ?  **In accordance with recommendations from the NKF-ASN Task force,** ?  [facility] is in the process of updating its eGFR calculation to the ?  2021 CKD-EPI creatinine equation that estimates kidney function ?  without a race variable. ?  ? ?GFR calc non Af Amer  ?Date Value Ref Range Status  ?01/11/2020 95 >59 mL/min/1.73 Final  ? ?eGFR  ?Date Value Ref Range Status  ?03/11/2021 77 >59 mL/min/1.73 Final  ?   ?  ?  Passed - Patient is not pregnant  ?  ?  Passed - Valid encounter within last 6 months  ?  Recent Outpatient Visits   ?      ? 1 month ago Palpitations  ? [facility] [place], Dionne Bucy, MD  ? 6 months ago Essential hypertension  ? [facility] [place], Dionne Bucy, MD  ? 1 year ago Rash  ? [facility] [place], [place], [place]  ? 1 year ago Essential hypertension  ? [facility] [place], [place], [place]  ? 1 year ago Essential hypertension  ? [facility] [place],  [place], [place]  ?  ?  ? ?  ?  ?  ? ?

## 2021-05-09 ENCOUNTER — Encounter: Payer: Self-pay | Admitting: Family Medicine

## 2021-05-09 DIAGNOSIS — E7800 Pure hypercholesterolemia, unspecified: Secondary | ICD-10-CM

## 2021-05-12 MED ORDER — ATORVASTATIN CALCIUM 10 MG PO TABS: ORAL_TABLET | ORAL | 3 refills | Status: DC

## 2021-05-26 ENCOUNTER — Other Ambulatory Visit: Payer: Self-pay | Admitting: Family Medicine

## 2021-08-04 ENCOUNTER — Other Ambulatory Visit: Payer: Self-pay | Admitting: Family Medicine

## 2021-08-04 DIAGNOSIS — I10 Essential (primary) hypertension: Secondary | ICD-10-CM

## 2021-08-04 NOTE — Telephone Encounter (Signed)
Requested Prescriptions  Pending Prescriptions Disp Refills  . irbesartan-hydrochlorothiazide (AVALIDE) 300-12.5 MG tablet [Pharmacy Med Name: IRBESARTAN/HCTZ TABS 300/12.5MG ] 90 tablet 1    Sig: TAKE 1 TABLET DAILY     Cardiovascular: ARB + Diuretic Combos Failed - 08/04/2021 12:09 AM      Failed - Last BP in normal range    BP Readings from Last 1 Encounters:  03/11/21 (!) 136/91         Passed - K in normal range and within 180 days    Potassium  Date Value Ref Range Status  03/11/2021 4.0 3.5 - 5.2 mmol/L Final         Passed - Na in normal range and within 180 days    Sodium  Date Value Ref Range Status  03/11/2021 139 134 - 144 mmol/L Final         Passed - Cr in normal range and within 180 days    Creatinine, Ser  Date Value Ref Range Status  03/11/2021 0.85 0.57 - 1.00 mg/dL Final         Passed - eGFR is 10 or above and within 180 days    GFR calc Af Amer  Date Value Ref Range Status  01/11/2020 109 >59 mL/min/1.73 Final    Comment:    **In accordance with recommendations from the NKF-ASN Task force,**   [facility] is in the process of updating its eGFR calculation to the   2021 CKD-EPI creatinine equation that estimates kidney function   without a race variable.    GFR calc non Af Amer  Date Value Ref Range Status  01/11/2020 95 >59 mL/min/1.73 Final   eGFR  Date Value Ref Range Status  03/11/2021 77 >59 mL/min/1.73 Final         Passed - Patient is not pregnant      Passed - Valid encounter within last 6 months    Recent Outpatient Visits          4 months ago [place], Dionne Bucy, MD   9 months ago Essential hypertension   [facility] [place], Dionne Bucy, MD   1 year ago [place], [place]   1 year ago Essential hypertension   [facility] [place], [place]   1 year ago Essential hypertension   [facility] [place], [place]

## 2021-08-22 ENCOUNTER — Other Ambulatory Visit: Payer: Self-pay | Admitting: Family Medicine

## 2021-08-22 NOTE — Telephone Encounter (Signed)
Requested Prescriptions  Pending Prescriptions Disp Refills  . omeprazole (PRILOSEC) 20 MG capsule [Pharmacy Med Name: OMEPRAZOLE DR CAPS 20MG ] 90 capsule 3    Sig: TAKE 1 CAPSULE DAILY     Gastroenterology: Proton Pump Inhibitors Passed - 08/22/2021 12:13 AM      Passed - Valid encounter within last 12 months    Recent Outpatient Visits          5 months ago Palpitations   [facility] [place], [place], MD   10 months ago Essential hypertension   [facility] [place], [place], MD   1 year ago Rash   [facility], Trey Sailors   1 year ago Essential hypertension   [facility], Trey Sailors   1 year ago Essential hypertension   [facility] [place], [place]

## 2021-11-20 ENCOUNTER — Other Ambulatory Visit: Payer: Self-pay | Admitting: Family Medicine

## 2021-11-26 ENCOUNTER — Telehealth: Payer: Self-pay | Admitting: Family Medicine

## 2021-11-26 DIAGNOSIS — E7800 Pure hypercholesterolemia, unspecified: Secondary | ICD-10-CM

## 2021-11-26 NOTE — Telephone Encounter (Signed)
[facility] faxed refill request for the following medications:  atorvastatin (LIPITOR) 10 MG tablet   Please advise.

## 2021-11-27 MED ORDER — ATORVASTATIN CALCIUM 10 MG PO TABS: ORAL_TABLET | ORAL | 0 refills | Status: DC

## 2021-11-27 NOTE — Addendum Note (Signed)
Addended by: Shawna Orleans on: 11/27/2021 08:54 AM   Modules accepted: Orders

## 2022-01-30 ENCOUNTER — Other Ambulatory Visit: Payer: Self-pay | Admitting: Family Medicine

## 2022-01-30 DIAGNOSIS — I10 Essential (primary) hypertension: Secondary | ICD-10-CM

## 2022-01-30 NOTE — Telephone Encounter (Signed)
Called pt - Heather Russell to make an appointment.

## 2022-01-30 NOTE — Telephone Encounter (Signed)
Requested medications are due for refill today.  yes  Requested medications are on the active medications list.  yes  Last refill. 08/04/2021 #90 1 rf  Future visit scheduled.   no  Notes to clinic.  Pt is more than 3 months over for ov.    Requested Prescriptions  Pending Prescriptions Disp Refills   irbesartan-hydrochlorothiazide (AVALIDE) 300-12.5 MG tablet [Pharmacy Med Name: IRBESARTAN/HCTZ TABS 300/12.5MG] 90 tablet 3    Sig: TAKE 1 TABLET DAILY     Cardiovascular: ARB + Diuretic Combos Failed - 01/30/2022 12:13 AM      Failed - K in normal range and within 180 days    Potassium  Date Value Ref Range Status  03/11/2021 4.0 3.5 - 5.2 mmol/L Final         Failed - Na in normal range and within 180 days    Sodium  Date Value Ref Range Status  03/11/2021 139 134 - 144 mmol/L Final         Failed - Cr in normal range and within 180 days    Creatinine, Ser  Date Value Ref Range Status  03/11/2021 0.85 0.57 - 1.00 mg/dL Final         Failed - eGFR is 10 or above and within 180 days    GFR calc Af Amer  Date Value Ref Range Status  01/11/2020 109 >59 mL/min/1.73 Final    Comment:    **In accordance with recommendations from the NKF-ASN Task force,**   [facility] is in the process of updating its eGFR calculation to the   2021 CKD-EPI creatinine equation that estimates kidney function   without a race variable.    GFR calc non Af Amer  Date Value Ref Range Status  01/11/2020 95 >59 mL/min/1.73 Final   eGFR  Date Value Ref Range Status  03/11/2021 77 >59 mL/min/1.73 Final         Failed - Last BP in normal range    BP Readings from Last 1 Encounters:  03/11/21 (!) 136/91         Failed - Valid encounter within last 6 months    Recent Outpatient Visits           10 months ago [place], Dionne Bucy, MD   1 year ago Essential hypertension   [place], Dionne Bucy, [place]   1 year ago [place] Trinna Post, [place]   2 years ago Essential hypertension   [facility] Trinna Post, [place]   2 years ago Essential hypertension   [place], [place], [place] - Patient is not pregnant

## 2022-02-18 ENCOUNTER — Other Ambulatory Visit: Payer: Self-pay | Admitting: Family Medicine

## 2022-02-18 NOTE — Telephone Encounter (Signed)
Lmtcb to schedule an fu

## 2022-02-18 NOTE — Telephone Encounter (Signed)
Requested medication (s) are due for refill today: routing for review  Requested medication (s) are on the active medication list: yes  Last refill:  11/20/21  Future visit scheduled: no  Notes to clinic:  Unable to refill per protocol, courtesy refill already given, routing for provider approval.      Requested Prescriptions  Pending Prescriptions Disp Refills   omeprazole (PRILOSEC) 20 MG capsule [Pharmacy Med Name: OMEPRAZOLE DR CAPS 20MG ] 90 capsule 3    Sig: TAKE 1 CAPSULE DAILY (SCHEDULE OFFICE VISIT BEFORE ANY FUTURE REFILL)     Gastroenterology: Proton Pump Inhibitors Passed - 02/18/2022 12:13 AM      Passed - Valid encounter within last 12 months    Recent Outpatient Visits           11 months ago Palpitations   [facility] [place], Kenner, MD   1 year ago Essential hypertension   [facility] [place], [place], MD   1 year ago Rash   [facility], Trey Sailors   2 years ago Essential hypertension   [facility] [place], [place], Lavella [place]   2 years ago Essential hypertension   [facility] [place], [place]

## 2022-03-02 ENCOUNTER — Encounter: Payer: Self-pay | Admitting: Family Medicine

## 2022-03-02 ENCOUNTER — Ambulatory Visit (INDEPENDENT_AMBULATORY_CARE_PROVIDER_SITE_OTHER): Payer: Medicare HMO | Admitting: Family Medicine

## 2022-03-02 VITALS — BP 108/79 | HR 69 | Temp 98.7°F | Resp 16 | Wt 152.8 lb

## 2022-03-02 DIAGNOSIS — K219 Gastro-esophageal reflux disease without esophagitis: Secondary | ICD-10-CM

## 2022-03-02 DIAGNOSIS — I10 Essential (primary) hypertension: Secondary | ICD-10-CM

## 2022-03-02 DIAGNOSIS — E7800 Pure hypercholesterolemia, unspecified: Secondary | ICD-10-CM | POA: Diagnosis not present

## 2022-03-02 DIAGNOSIS — Z8249 Family history of ischemic heart disease and other diseases of the circulatory system: Secondary | ICD-10-CM | POA: Diagnosis not present

## 2022-03-02 MED ORDER — OMEPRAZOLE 20 MG PO CPDR: 20.0000 mg | DELAYED_RELEASE_CAPSULE | Freq: Every day | ORAL | 3 refills | Status: DC

## 2022-03-02 MED ORDER — ATORVASTATIN CALCIUM 10 MG PO TABS: ORAL_TABLET | ORAL | 3 refills | Status: DC

## 2022-03-02 MED ORDER — IRBESARTAN-HYDROCHLOROTHIAZIDE 300-12.5 MG PO TABS: 1.0000 | ORAL_TABLET | Freq: Every day | ORAL | 3 refills | Status: DC

## 2022-03-02 NOTE — Assessment & Plan Note (Signed)
2 close relatives (father and brother) with MIs in their early 66s Managing risk factors, but would like to risk stratify with CT Cardic calcium scoring

## 2022-03-02 NOTE — Progress Notes (Signed)
I,Sulibeya S Dimas,acting as a Education administrator for Lavon Paganini, MD.,have documented all relevant documentation on the behalf of Lavon Paganini, MD,as directed by  Lavon Paganini, MD while in the presence of Lavon Paganini, MD.     Established patient visit   Patient: Heather Russell   DOB: [birth year]   67 y.o. Female  MRN: [id number] Visit Date: 03/02/2022  Today's healthcare provider: Lavon Paganini, MD   Chief Complaint  Patient presents with   Hyperlipidemia   Hypertension   Subjective    HPI  Hypertension, follow-up  BP Readings from Last 3 Encounters:  03/02/22 108/79  03/11/21 (!) 136/91  10/15/20 126/86   Wt Readings from Last 3 Encounters:  03/02/22 152 lb 12.8 oz (69.3 kg)  03/11/21 152 lb (68.9 kg)  10/15/20 151 lb 6.4 oz (68.7 kg)     She was last seen for hypertension 1 years ago.  BP at that visit was 136/91. Management since that visit includes no changes.  She reports excellent compliance with treatment. She is not having side effects.   Outside blood pressures are stable.  Pertinent labs Lab Results  Component Value Date   CHOL 214 (H) 10/15/2020   HDL 81 10/15/2020   LDLCALC 109 (H) 10/15/2020   TRIG 143 10/15/2020   CHOLHDL 2.6 10/15/2020   Lab Results  Component Value Date   NA 139 03/11/2021   K 4.0 03/11/2021   CREATININE 0.85 03/11/2021   EGFR 77 03/11/2021   GLUCOSE 96 03/11/2021   TSH 1.280 03/11/2021     The 10-year ASCVD risk score (Arnett DK, et al., 2019) is: 4.2%  --------------------------------------------------------------------------------------------------- Lipid/Cholesterol, Follow-up  Last lipid panel Other pertinent labs  Lab Results  Component Value Date   CHOL 214 (H) 10/15/2020   HDL 81 10/15/2020   LDLCALC 109 (H) 10/15/2020   TRIG 143 10/15/2020   CHOLHDL 2.6 10/15/2020   Lab Results  Component Value Date   ALT 25 03/11/2021   AST 17 03/11/2021   PLT 240 03/11/2021   TSH 1.280  03/11/2021     She was last seen for this 1 years ago.  Management since that visit includes no changes.  She reports excellent compliance with treatment. She is not having side effects.  The 10-year ASCVD risk score (Arnett DK, et al., 2019) is: 4.2%  ---------------------------------------------------------------------------------------------------  Family history of dad and one brother with CAD in their 52s.  Maybe family members with cardiac deaths  Medications: Outpatient Medications Prior to Visit  Medication Sig   Multiple Vitamin (MULTI-VITAMINS) TABS Take 1 tablet by mouth daily.   [DISCONTINUED] atorvastatin (LIPITOR) 10 MG tablet Take one tablet every other night. Please schedule office visit before any future refill.   [DISCONTINUED] irbesartan-hydrochlorothiazide (AVALIDE) 300-12.5 MG tablet Take 1 tablet by mouth daily. Please schedule office visit before any future refill.   [DISCONTINUED] omeprazole (PRILOSEC) 20 MG capsule TAKE 1 CAPSULE DAILY (SCHEDULE OFFICE VISIT BEFORE ANY FUTURE REFILL)   [DISCONTINUED] hydrOXYzine (ATARAX) 10 MG tablet Take 1 tablet (10 mg total) by mouth 3 (three) times daily as needed.   No facility-administered medications prior to visit.    Review of Systems  Eyes:  Negative for visual disturbance.  Respiratory:  Negative for chest tightness and shortness of breath.   Cardiovascular:  Negative for chest pain and leg swelling.  Neurological:  Negative for dizziness, light-headedness and headaches.       Objective    BP 108/79 (BP Location: Right Arm, Patient Position: Sitting, Cuff  Size: Large)   Pulse 69   Temp 98.7 F (37.1 C) (Oral)   Resp 16   Wt 152 lb 12.8 oz (69.3 kg)   SpO2 98%   BMI 27.07 kg/m  BP Readings from Last 3 Encounters:  03/02/22 108/79  03/11/21 (!) 136/91  10/15/20 126/86   Wt Readings from Last 3 Encounters:  03/02/22 152 lb 12.8 oz (69.3 kg)  03/11/21 152 lb (68.9 kg)  10/15/20 151 lb 6.4 oz (68.7  kg)      Physical Exam Vitals reviewed.  Constitutional:      General: She is not in acute distress.    Appearance: Normal appearance. She is well-developed. She is not diaphoretic.  HENT:     Head: Normocephalic and atraumatic.  Eyes:     General: No scleral icterus.    Conjunctiva/sclera: Conjunctivae normal.  Neck:     Thyroid: No thyromegaly.  Cardiovascular:     Rate and Rhythm: Normal rate and regular rhythm.     Pulses: Normal pulses.     Heart sounds: Normal heart sounds. No murmur heard. Pulmonary:     Effort: Pulmonary effort is normal. No respiratory distress.     Breath sounds: Normal breath sounds. No wheezing, rhonchi or rales.  Musculoskeletal:     Cervical back: Neck supple.     Right lower leg: No edema.     Left lower leg: No edema.  Lymphadenopathy:     Cervical: No cervical adenopathy.  Skin:    General: Skin is warm and dry.     Findings: No rash.  Neurological:     Mental Status: She is alert and oriented to person, place, and time. Mental status is at baseline.  Psychiatric:        Mood and Affect: Mood normal.        Behavior: Behavior normal.       No results found for any visits on 03/02/22.  Assessment & Plan     Problem List Items Addressed This Visit       Cardiovascular and Mediastinum   Essential hypertension - Primary    Well controlled Continue current medications Recheck metabolic panel F/u in 6 months       Relevant Medications   atorvastatin (LIPITOR) 10 MG tablet   irbesartan-hydrochlorothiazide (AVALIDE) 300-12.5 MG tablet   Other Relevant Orders   Comprehensive metabolic panel   CT CARDIAC SCORING (SELF PAY ONLY)     Digestive   GERD (gastroesophageal reflux disease)    Well controlled Continue PPI      Relevant Medications   omeprazole (PRILOSEC) 20 MG capsule     Other   Hypercholesteremia    Previously well controlled Continue statin Repeat FLP and CMP      Relevant Medications   atorvastatin  (LIPITOR) 10 MG tablet   irbesartan-hydrochlorothiazide (AVALIDE) 300-12.5 MG tablet   Other Relevant Orders   Comprehensive metabolic panel   Lipid panel   CT CARDIAC SCORING (SELF PAY ONLY)   Family history of early CAD    2 close relatives (father and brother) with MIs in their early 65s Managing risk factors, but would like to risk stratify with CT Cardic calcium scoring      Relevant Orders   CT CARDIAC SCORING (SELF PAY ONLY)     Return in about 3 months (around 06/01/2022) for welcome to Medicare visit.      I, Shirlee Latch, MD, have reviewed all documentation for this visit. The documentation on 03/02/22 for  the exam, diagnosis, procedures, and orders are all accurate and complete.   Quintus Premo, Dionne Bucy, MD, MPH [facility]

## 2022-03-02 NOTE — Assessment & Plan Note (Signed)
Well controlled Continue current medications Recheck metabolic panel F/u in 6 months  

## 2022-03-02 NOTE — Assessment & Plan Note (Signed)
Well controlled Continue PPI 

## 2022-03-02 NOTE — Assessment & Plan Note (Signed)
Previously well controlled Continue statin Repeat FLP and CMP  

## 2022-03-03 DIAGNOSIS — E7800 Pure hypercholesterolemia, unspecified: Secondary | ICD-10-CM | POA: Diagnosis not present

## 2022-03-03 DIAGNOSIS — I10 Essential (primary) hypertension: Secondary | ICD-10-CM | POA: Diagnosis not present

## 2022-03-04 LAB — COMPREHENSIVE METABOLIC PANEL
ALT: 27 IU/L (ref 0–32)
AST: 20 IU/L (ref 0–40)
Albumin/Globulin Ratio: 2.4 — ABNORMAL HIGH (ref 1.2–2.2)
Albumin: 4.7 g/dL (ref 3.9–4.9)
Alkaline Phosphatase: 60 IU/L (ref 44–121)
BUN/Creatinine Ratio: 14 (ref 12–28)
BUN: 12 mg/dL (ref 8–27)
Bilirubin Total: 0.5 mg/dL (ref 0.0–1.2)
CO2: 24 mmol/L (ref 20–29)
Calcium: 9.5 mg/dL (ref 8.7–10.3)
Chloride: 97 mmol/L (ref 96–106)
Creatinine, Ser: 0.85 mg/dL (ref 0.57–1.00)
Globulin, Total: 2 g/dL (ref 1.5–4.5)
Glucose: 90 mg/dL (ref 70–99)
Potassium: 3.9 mmol/L (ref 3.5–5.2)
Sodium: 137 mmol/L (ref 134–144)
Total Protein: 6.7 g/dL (ref 6.0–8.5)
eGFR: 76 mL/min/{1.73_m2} (ref >59)

## 2022-03-04 LAB — LIPID PANEL
Chol/HDL Ratio: 2.3 ratio (ref 0.0–4.4)
Cholesterol, Total: 165 mg/dL (ref 100–199)
HDL: 71 mg/dL (ref >39)
LDL Chol Calc (NIH): 75 mg/dL (ref 0–99)
Triglycerides: 108 mg/dL (ref 0–149)
VLDL Cholesterol Cal: 19 mg/dL (ref 5–40)

## 2022-03-11 ENCOUNTER — Ambulatory Visit
Admission: RE | Admit: 2022-03-11 | Discharge: 2022-03-11 | Disposition: A | Discharge status: Home / Self Care | Payer: Self-pay | Source: Ambulatory Visit | Attending: Family Medicine | Admitting: Family Medicine

## 2022-03-11 DIAGNOSIS — I10 Essential (primary) hypertension: Secondary | ICD-10-CM | POA: Insufficient documentation

## 2022-03-11 DIAGNOSIS — E7800 Pure hypercholesterolemia, unspecified: Secondary | ICD-10-CM | POA: Insufficient documentation

## 2022-03-11 DIAGNOSIS — Z8249 Family history of ischemic heart disease and other diseases of the circulatory system: Secondary | ICD-10-CM | POA: Insufficient documentation

## 2022-04-30 ENCOUNTER — Other Ambulatory Visit: Payer: Self-pay | Admitting: Family Medicine

## 2022-04-30 NOTE — Telephone Encounter (Signed)
Refused this request because rx sent to [id number] on 03/02/2022  Requested Prescriptions  Pending Prescriptions Disp Refills   irbesartan-hydrochlorothiazide (AVALIDE) 300-12.5 MG tablet [Pharmacy Med Name: IRBESARTAN/HCTZ TABS 300/12.'5MG'$ ] 90 tablet 3    Sig: TAKE 1 TABLET DAILY (SCHEDULE OFFICE VISIT BEFORE ANY FUTURE REFILL)     Cardiovascular: ARB + Diuretic Combos Passed - 04/30/2022 12:08 AM      Passed - K in normal range and within 180 days    Potassium  Date Value Ref Range Status  03/03/2022 3.9 3.5 - 5.2 mmol/L Final         Passed - Na in normal range and within 180 days    Sodium  Date Value Ref Range Status  03/03/2022 137 134 - 144 mmol/L Final         Passed - Cr in normal range and within 180 days    Creatinine, Ser  Date Value Ref Range Status  03/03/2022 0.85 0.57 - 1.00 mg/dL Final         Passed - eGFR is 10 or above and within 180 days    GFR calc Af Amer  Date Value Ref Range Status  01/11/2020 109 >59 mL/min/1.73 Final    Comment:    **In accordance with recommendations from the NKF-ASN Task force,**   [facility] is in the process of updating its eGFR calculation to the   2021 CKD-EPI creatinine equation that estimates kidney function   without a race variable.    GFR calc non Af Amer  Date Value Ref Range Status  01/11/2020 95 >59 mL/min/1.73 Final   eGFR  Date Value Ref Range Status  03/03/2022 76 >59 mL/min/1.73 Final         Passed - Patient is not pregnant      Passed - Last BP in normal range    BP Readings from Last 1 Encounters:  03/02/22 108/79         Passed - Valid encounter within last 6 months    Recent Outpatient Visits           1 month ago Essential hypertension   [place], Dionne Bucy, [place]   1 year ago [place], Dionne Bucy, MD   1 year ago Essential hypertension   [place], Dionne Bucy, MD   2  years ago [place] Trinna Post, [place]   2 years ago Essential hypertension   [place], [place], [place]

## 2022-05-05 ENCOUNTER — Other Ambulatory Visit: Payer: Self-pay | Admitting: Family Medicine

## 2022-05-05 DIAGNOSIS — Z1231 Encounter for screening mammogram for malignant neoplasm of breast: Secondary | ICD-10-CM

## 2022-05-19 ENCOUNTER — Other Ambulatory Visit: Payer: Self-pay | Admitting: Family Medicine

## 2022-05-19 NOTE — Telephone Encounter (Signed)
Requested Prescriptions  Pending Prescriptions Disp Refills   omeprazole (PRILOSEC) 20 MG capsule [Pharmacy Med Name: OMEPRAZOLE DR CAPS 20MG ] 90 capsule 3    Sig: TAKE 1 CAPSULE DAILY (SCHEDULE OFFICE VISIT BEFORE ANY FUTURE REFILL)     Gastroenterology: Proton Pump Inhibitors Passed - 05/19/2022 12:14 AM      Passed - Valid encounter within last 12 months    Recent Outpatient Visits           2 months ago Essential hypertension   [place], Dionne Bucy, MD   1 year ago [place], Dionne Bucy, MD   1 year ago Essential hypertension   [place], Dionne Bucy, MD   2 years ago [place] Trinna Post, [place]   2 years ago Essential hypertension   [place], [place], [place]

## 2022-05-20 ENCOUNTER — Ambulatory Visit
Admission: RE | Admit: 2022-05-20 | Discharge: 2022-05-20 | Disposition: A | Discharge status: Home / Self Care | Payer: Medicare HMO | Source: Ambulatory Visit | Attending: Family Medicine | Admitting: Family Medicine

## 2022-05-20 DIAGNOSIS — Z1231 Encounter for screening mammogram for malignant neoplasm of breast: Secondary | ICD-10-CM

## 2022-05-25 DIAGNOSIS — M9902 Segmental and somatic dysfunction of thoracic region: Secondary | ICD-10-CM | POA: Diagnosis not present

## 2022-05-25 DIAGNOSIS — M9903 Segmental and somatic dysfunction of lumbar region: Secondary | ICD-10-CM | POA: Diagnosis not present

## 2022-05-25 DIAGNOSIS — M542 Cervicalgia: Secondary | ICD-10-CM | POA: Diagnosis not present

## 2022-05-25 DIAGNOSIS — M5387 Other specified dorsopathies, lumbosacral region: Secondary | ICD-10-CM | POA: Diagnosis not present

## 2022-05-25 DIAGNOSIS — M9901 Segmental and somatic dysfunction of cervical region: Secondary | ICD-10-CM | POA: Diagnosis not present

## 2022-07-24 IMAGING — MG DIGITAL DIAGNOSTIC BILAT W/ TOMO W/ CAD
6 of 12 series · 6 of 36 positions shown · non-contrast
Comparison: Previous exam(s).

CLINICAL DATA: 66-year-old female with a palpable right breast lump
for 1 month.

EXAM:
DIGITAL DIAGNOSTIC BILATERAL MAMMOGRAM WITH TOMOSYNTHESIS AND CAD;
ULTRASOUND RIGHT BREAST LIMITED
TECHNIQUE: Bilateral digital diagnostic mammography and breast tomosynthesis
was performed. The images were evaluated with computer-aided
detection.; Targeted ultrasound examination of the right breast was
performed

[R XCCL synth-2D (1 of 2)]
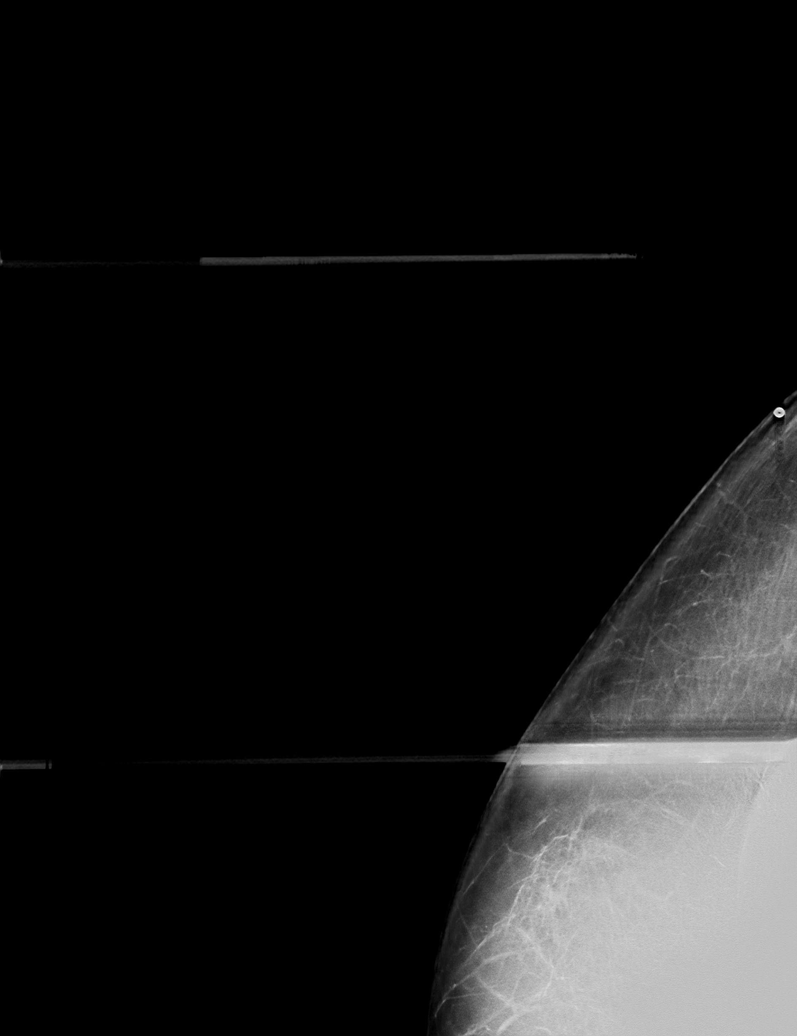

[L MLO synth-2D]
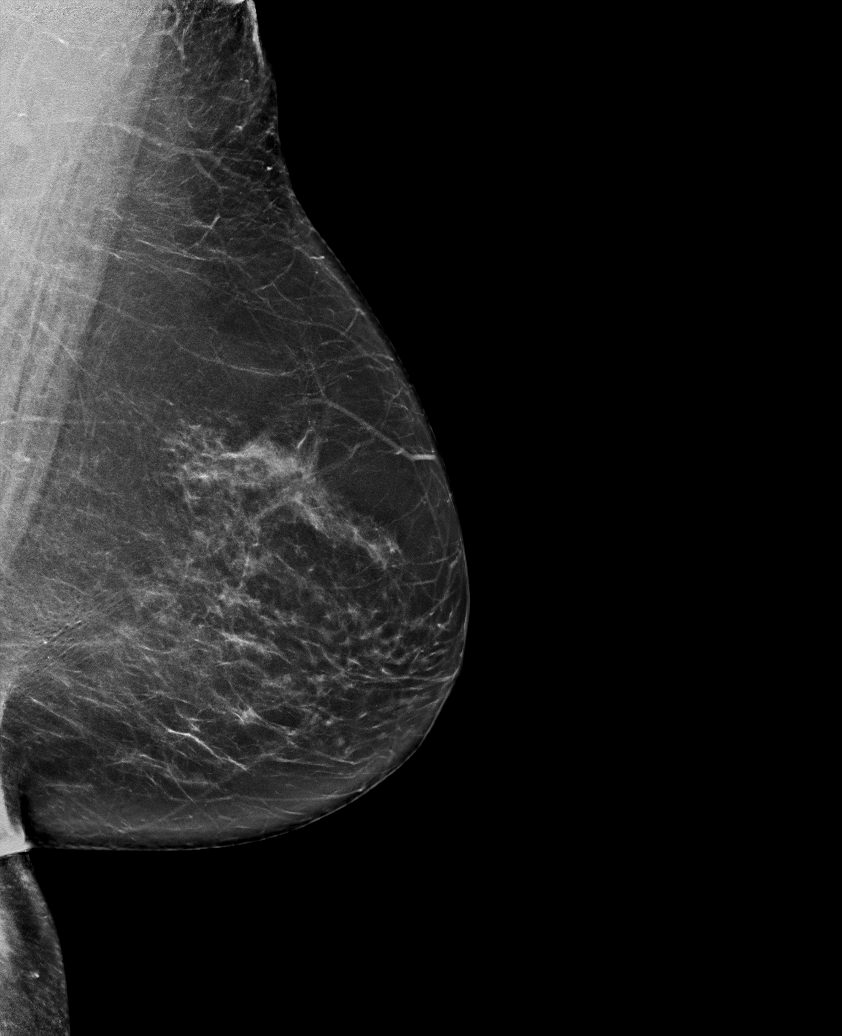

[L CC synth-2D]
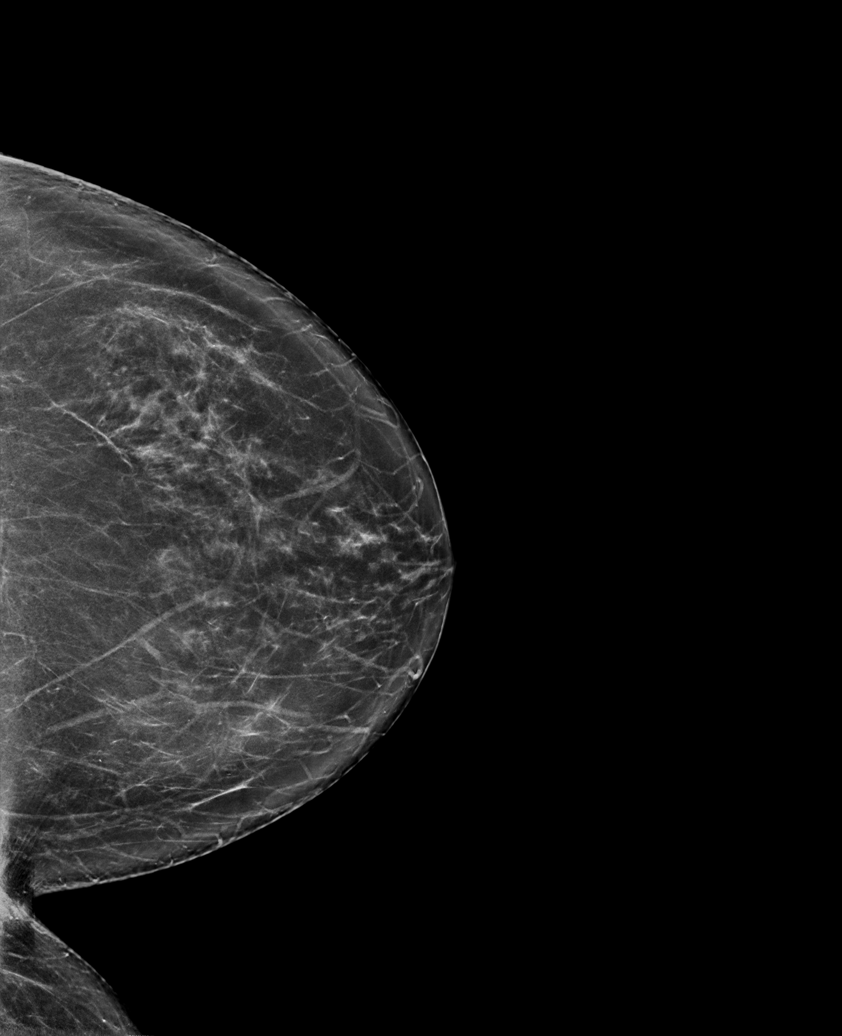

[R CC synth-2D]
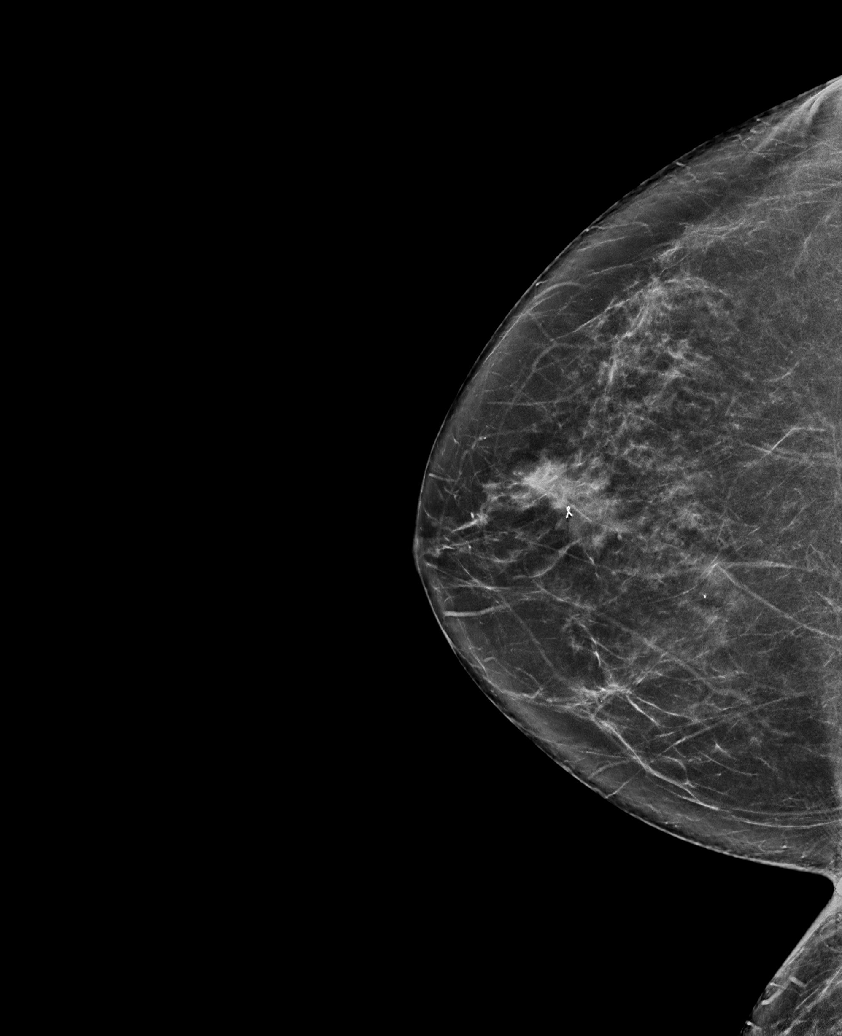

[R MLO synth-2D]
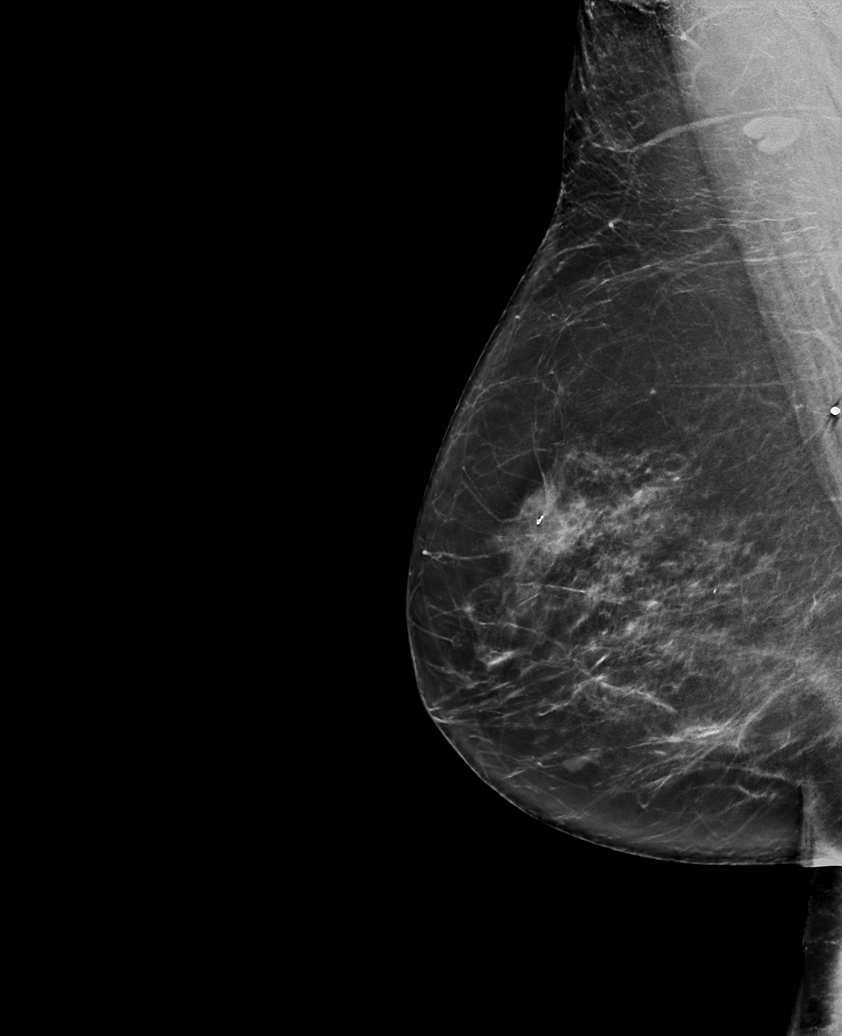

[R XCCL synth-2D (2 of 2)]
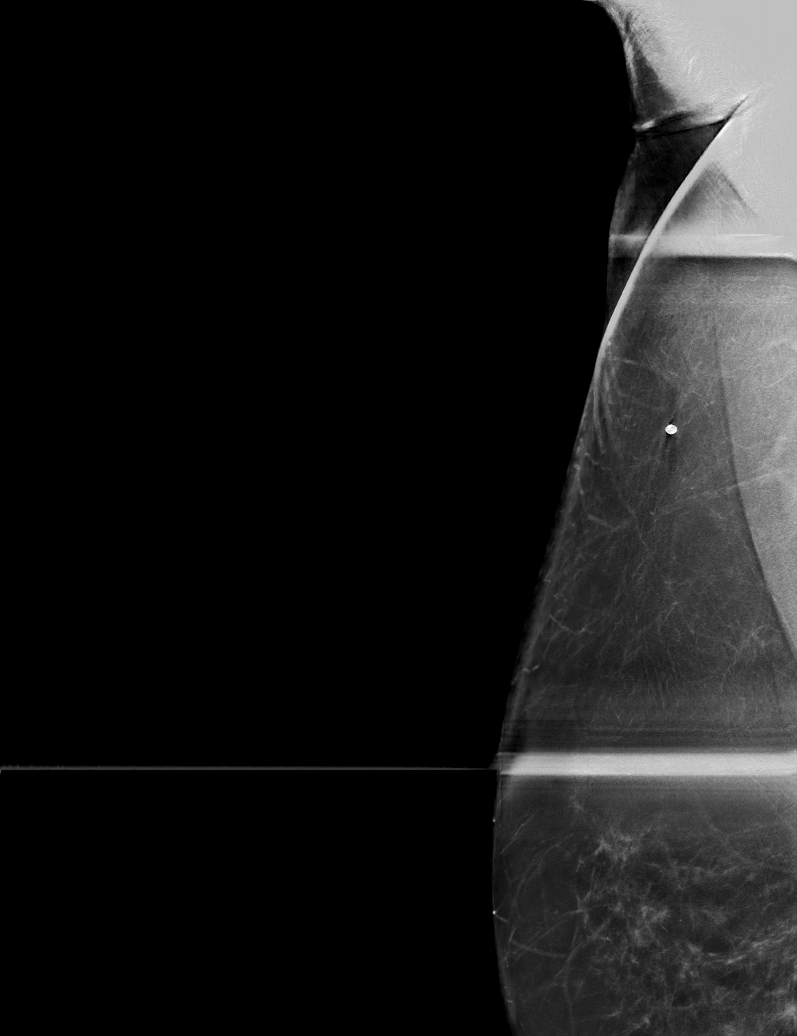

[6 of 36 positions shown; findings below may reference images not displayed]

ACR Breast Density Category c: The breast tissue is heterogeneously
dense, which may obscure small masses.
FINDINGS: A radiopaque BB was placed at the site of the patient's palpable
lump in the far lateral right breast. No focal or suspicious
mammographic findings are seen deep to the radiopaque BB. No
suspicious mammographic findings in the remainder of either breast.
Stable post biopsy changes in the central right breast.

Targeted ultrasound is performed, showing no focal or suspicious
sonographic findings in the lateral right breast.
IMPRESSION: 1. No mammographic evidence of malignancy in either breast.
2. Unremarkable ultrasound evaluation of the lateral right breast.

RECOMMENDATION:
1. Clinical follow-up recommended for the palpable area of concern
in the right breast. Any further workup should be based on clinical
grounds.
2.  Screening mammogram in one year.(Code:[id number])

I have discussed the findings and recommendations with the patient.
If applicable, a reminder letter will be sent to the patient
regarding the next appointment.

BI-RADS CATEGORY  2: Benign.

## 2022-11-23 DIAGNOSIS — M25531 Pain in right wrist: Secondary | ICD-10-CM | POA: Diagnosis not present

## 2022-11-23 DIAGNOSIS — S52501A Unspecified fracture of the lower end of right radius, initial encounter for closed fracture: Secondary | ICD-10-CM | POA: Diagnosis not present

## 2022-11-23 DIAGNOSIS — M25431 Effusion, right wrist: Secondary | ICD-10-CM | POA: Diagnosis not present

## 2022-12-02 DIAGNOSIS — S52501A Unspecified fracture of the lower end of right radius, initial encounter for closed fracture: Secondary | ICD-10-CM | POA: Diagnosis not present

## 2023-01-06 DIAGNOSIS — S52501A Unspecified fracture of the lower end of right radius, initial encounter for closed fracture: Secondary | ICD-10-CM | POA: Diagnosis not present

## 2023-02-05 NOTE — Progress Notes (Unsigned)
Medicare Initial Preventative Physical Exam    Patient: Heather Russell, Female    DOB: 04-Nov-1955, 67 y.o.   MRN: [id number] Visit Date: 02/08/2023  Today's Provider: Shirlee Latch, MD   Chief Complaint  Patient presents with   Annual Exam    Last CPE 09/13/2018   Subjective    Medicare Initial Preventative Physical Exam Heather Russell is a 67 y.o. female who presents today for her Initial Preventative Physical Exam.   Discussed the use of AI scribe software for clinical note transcription with the patient, who gave verbal consent to proceed.  History of Present Illness   The patient, a 67 year old individual with a history of high blood pressure, cholesterol, and sleep apnea, presents for a wellness visit. The patient recently retired and transitioned to [employer]. The patient reports no significant changes in their health status since the last visit. The patient is on atorvastatin for cholesterol, which has been causing leg cramps. The patient also takes omeprazole for stomach issues. Recently, the patient experienced food poisoning, which has resulted in persistent stomach discomfort. The patient uses a CPAP machine for sleep apnea and is interested in getting a new machine as the current one is several years old.        Social History   Socioeconomic History   Marital status: Single    Spouse name: Not on file   Number of children: Not on file   Years of education: Not on file   Highest education level: Associate degree: academic program  Occupational History   Not on file  Tobacco Use   Smoking status: Never   Smokeless tobacco: Never  Vaping Use   Vaping status: Never Used  Substance and Sexual Activity   Alcohol use: Yes    Alcohol/week: 6.0 - 9.0 standard drinks of alcohol    Types: 3 Glasses of wine, 3 - 6 Cans of beer per week    Comment: social drinker   Drug use: No   Sexual activity: Not on file  Other Topics Concern   Not on file   Social History Narrative   Not on file   Social Drivers of Health   Financial Resource Strain: Low Risk  (02/06/2023)   Overall Financial Resource Strain (CARDIA)    Difficulty of Paying Living Expenses: Not hard at all  Food Insecurity: No Food Insecurity (02/06/2023)   Hunger Vital Sign    Worried About Running Out of Food in the Last Year: Never true    Ran Out of Food in the Last Year: Never true  Transportation Needs: No Transportation Needs (02/06/2023)   PRAPARE - Administrator, Civil Service (Medical): No    Lack of Transportation (Non-Medical): No  Physical Activity: Unknown (02/06/2023)   Exercise Vital Sign    Days of Exercise per Week: 0 days    Minutes of Exercise per Session: Not on file  Stress: No Stress Concern Present (02/06/2023)   [employer] of Occupational Health - Occupational Stress Questionnaire    Feeling of Stress : Only a little  Social Connections: Moderately Integrated (02/06/2023)   Social Connection and Isolation Panel [NHANES]    Frequency of Communication with Friends and Family: More than three times a week    Frequency of Social Gatherings with Friends and Family: Once a week    Attends Religious Services: Never    Database administrator or Organizations: Yes    Attends Engineer, structural: More than 4 times per  year    Marital Status: Married  Catering manager Violence: Not on file    Past Medical History:  Diagnosis Date   Hypertension      Patient Active Problem List   Diagnosis Date Noted   OSA on CPAP 02/08/2023   Family history of early CAD 03/02/2022   GERD (gastroesophageal reflux disease) 10/15/2020   Hypercholesteremia 09/17/2016   Essential hypertension 09/17/2016    Past Surgical History:  Procedure Laterality Date   BREAST BIOPSY Right 2007   benign    Her family history includes Healthy in her brother; Heart attack (age of onset: 109) in her father; Heart disease in her brother,  father, and mother; Hyperlipidemia in her father; Lung disease in her mother; Pancreatic cancer in her cousin.   Current Outpatient Medications:    Multiple Vitamin (MULTI-VITAMINS) TABS, Take 1 tablet by mouth daily., Disp: , Rfl:    rosuvastatin (CRESTOR) 5 MG tablet, Take 1 tablet (5 mg total) by mouth every other day., Disp: 90 tablet, Rfl: 3   irbesartan-hydrochlorothiazide (AVALIDE) 300-12.5 MG tablet, Take 1 tablet by mouth daily., Disp: 90 tablet, Rfl: 3   omeprazole (PRILOSEC) 20 MG capsule, Take 1 capsule (20 mg total) by mouth daily., Disp: 90 capsule, Rfl: 3   Patient Care Team: Erasmo Downer, MD as PCP - General (Family Medicine)  Review of Systems     Objective    Vitals: BP 132/80 (BP Location: Left Arm, Patient Position: Sitting, Cuff Size: Normal)   Pulse 70   Temp 99.1 F (37.3 C)   Resp 16 Comment: no o2 available  Ht 5\' 3"  (1.6 m)   Wt 152 lb 9.6 oz (69.2 kg)   SpO2 95%   BMI 27.03 kg/m  Vision Screening - Comments:: Eye glasses not with patient and has scheduled eye exam next week Physical Exam Vitals reviewed.  Constitutional:      General: She is not in acute distress.    Appearance: Normal appearance. She is well-developed. She is not diaphoretic.  HENT:     Head: Normocephalic and atraumatic.     Right Ear: Tympanic membrane, ear canal and external ear normal.     Left Ear: Tympanic membrane, ear canal and external ear normal.     Nose: Nose normal.     Mouth/Throat:     Mouth: Mucous membranes are moist.     Pharynx: Oropharynx is clear. No oropharyngeal exudate.  Eyes:     General: No scleral icterus.    Conjunctiva/sclera: Conjunctivae normal.     Pupils: Pupils are equal, round, and reactive to light.  Neck:     Thyroid: No thyromegaly.  Cardiovascular:     Rate and Rhythm: Normal rate and regular rhythm.     Heart sounds: Normal heart sounds. No murmur heard. Pulmonary:     Effort: Pulmonary effort is normal. No respiratory  distress.     Breath sounds: Normal breath sounds. No wheezing or rales.  Abdominal:     General: There is no distension.     Palpations: Abdomen is soft.     Tenderness: There is no abdominal tenderness.  Musculoskeletal:        General: No deformity.     Cervical back: Neck supple.     Right lower leg: No edema.     Left lower leg: No edema.  Lymphadenopathy:     Cervical: No cervical adenopathy.  Skin:    General: Skin is warm and dry.  Findings: No rash.  Neurological:     Mental Status: She is alert and oriented to person, place, and time. Mental status is at baseline.     Gait: Gait normal.  Psychiatric:        Mood and Affect: Mood normal.        Behavior: Behavior normal.        Thought Content: Thought content normal.      Activities of Daily Living    02/08/2023    9:19 AM 03/02/2022    4:04 PM  In your present state of health, do you have any difficulty performing the following activities:  Hearing? 0 0  Vision? 0 0  Difficulty concentrating or making decisions? 0 0  Walking or climbing stairs? 0 0  Dressing or bathing? 0 0  Doing errands, shopping? 0 0  Preparing Food and eating ? N   Using the Toilet? N   In the past six months, have you accidently leaked urine? N   Do you have problems with loss of bowel control? N   Managing your Medications? N   Managing your Finances? N   Housekeeping or managing your Housekeeping? N     Fall Risk Assessment    02/08/2023    9:23 AM 03/02/2022    4:04 PM 10/15/2020    8:24 AM 01/11/2020    1:36 PM 09/21/2017    8:40 AM  Fall Risk   Falls in the past year? 1 0 0 0 No  Number falls in past yr: 0 0 0 0   Injury with Fall? 1 0 0 0   Risk for fall due to : History of fall(s) No Fall Risks No Fall Risks No Fall Risks   Follow up Falls prevention discussed Falls evaluation completed Falls evaluation completed Falls evaluation completed      Depression Screen    02/08/2023    9:08 AM 03/02/2022    4:04 PM  10/15/2020    8:23 AM 01/11/2020    1:36 PM  PHQ 2/9 Scores  PHQ - 2 Score 0 0 0 0  PHQ- 9 Score 1 0 0 0       02/08/2023    9:24 AM  6CIT Screen  What Year? 0 points  What month? 0 points  What time? 0 points  Count back from 20 0 points  Months in reverse 0 points  Repeat phrase 2 points  Total Score 2 points    EKG: NSR  Vision Screening - Comments:: Eye glasses not with patient and has scheduled eye exam next week   No results found for any visits on 02/08/23.  Assessment & Plan      Initial Preventative Physical Exam  Reviewed patient's Family Medical History Reviewed and updated list of patient's medical providers Assessment of cognitive impairment was done Assessed patient's functional ability Established a written schedule for health screening services Health Risk Assessent Completed and Reviewed  Exercise Activities and Dietary recommendations  Goals   None     Immunization History  Administered Date(s) Administered   Influenza,inj,Quad PF,6+ Mos 12/02/2018, 01/11/2020   Influenza-Unspecified 11/13/2021, 12/08/2022   Moderna Covid-19 Vaccine Bivalent Booster 65yrs & up 01/05/2021   Moderna Sars-Covid-2 Vaccination 05/01/2019, 05/29/2019, 12/29/2019   Pfizer(Comirnaty)Fall Seasonal Vaccine 12 years and older 11/13/2021   Tdap 05/14/2011, 09/14/2022    Health Maintenance  Topic Date Due   Zoster Vaccines- Shingrix (1 of 2) Never done   Cervical Cancer Screening (HPV/Pap Cotest)  04/04/2019  Pneumonia Vaccine 103+ Years old (1 of 1 - PCV) Never done   DEXA SCAN  Never done   COVID-19 Vaccine (6 - 2024-25 season) 10/25/2022   Fecal DNA (Cologuard)  01/30/2023   Medicare Annual Wellness (AWV)  02/08/2024   MAMMOGRAM  05/19/2024   DTaP/Tdap/Td (3 - Td or Tdap) 09/13/2032   INFLUENZA VACCINE  Completed   Hepatitis C Screening  Completed   HIV Screening  Completed   HPV VACCINES  Aged Out     Discussed health benefits of physical activity, and  encouraged her to engage in regular exercise appropriate for her age and condition.   Problem List Items Addressed This Visit       Cardiovascular and Mediastinum   Essential hypertension   On irbesartan/HCTZ 300/12.5 mg as needed, recently used due to stress from retirement. Blood pressure currently well-controlled. Prefers to refill medication for occasional use. - Refill irbesartan/HCTZ 300/12.5 mg      Relevant Medications   rosuvastatin (CRESTOR) 5 MG tablet   irbesartan-hydrochlorothiazide (AVALIDE) 300-12.5 MG tablet   Other Relevant Orders   Comprehensive metabolic panel   Lipid panel     Respiratory   OSA on CPAP   Inquires about replacing 18-26 year old CPAP machine. Discussed possibility of insurance coverage and benefits of an auto-adjusting CPAP machine. Explained that insurance may cover new machine and that new filters and tubes should be provided regularly. - Submit request for new CPAP machine with auto-adjusting feature - Ensure they receive new filters and tubes        Digestive   GERD (gastroesophageal reflux disease)   Reports increased acid reflux symptoms following suspected food poisoning incident. Currently on omeprazole 20 mg. Discussed that blood counts can help rule out anemia or bleeding, which are markers for ulcers. Explained that H. pylori testing is inaccurate while on omeprazole. - Order blood counts to check for anemia or bleeding - Continue omeprazole 20 mg      Relevant Medications   omeprazole (PRILOSEC) 20 MG capsule     Other   Hypercholesteremia   Reports leg cramps associated with atorvastatin 10 mg every other day. Discussed potential switch to rosuvastatin 5 mg every other day to minimize side effects. Explained that rosuvastatin and atorvastatin are both statins but processed differently in the body. Discussed CoQ10 supplementation to mitigate muscle cramps caused by statin-induced CoQ10 depletion. - Switch atorvastatin to rosuvastatin  5 mg every other day - Recommend CoQ10 supplementation      Relevant Medications   rosuvastatin (CRESTOR) 5 MG tablet   irbesartan-hydrochlorothiazide (AVALIDE) 300-12.5 MG tablet   Other Relevant Orders   Comprehensive metabolic panel   Lipid panel   Other Visit Diagnoses       Welcome to Medicare preventive visit    -  Primary     Encounter for annual physical exam       Relevant Orders   Comprehensive metabolic panel   Lipid panel   CBC w/Diff/Platelet     Breast cancer screening by mammogram       Relevant Orders   MM 3D SCREENING MAMMOGRAM BILATERAL BREAST     Postmenopausal       Relevant Orders   DG Bone Density     Colon cancer screening       Relevant Orders   Cologuard     Long-term use of high-risk medication       Relevant Orders   CBC w/Diff/Platelet     Immunization due  Relevant Orders   Pneumococcal conjugate vaccine 20-valent (Prevnar 20)     Need for pneumococcal vaccine       Relevant Orders   Pneumococcal conjugate vaccine 20-valent (Prevnar 20)     Encounter for annual wellness exam in Medicare patient       Relevant Orders   EKG 12-Lead           Welcome to Medicare Visit 66 year old individual recently retired and new to [employer]. Visit includes comprehensive assessment of health status, including functional status, vision screening, and EKG. Patient declined vision screening due to lack of glasses but is compliant with regular eye doctor visits. - Perform EKG - Complete functional status survey  General Health Maintenance Up to date on most vaccinations and screenings. Discussed importance of pneumonia and shingles vaccines, bone density scan, and colon cancer screening. Explained that pneumonia vaccine is a one-time shot, shingles vaccine can cause more soreness and fatigue, and Cologuard is about 80% effective but less invasive than colonoscopy. - Administer pneumonia vaccine - Order bone density scan - Order mammogram for March -  Send Cologuard kit for colon cancer screening - Recommend scheduling shingles vaccine at pharmacy  Follow-up - Schedule follow-up visit in 6 months - Order labs for kidney and liver function, and blood sugar.        Return in about 6 months (around 08/09/2023) for chronic disease f/u.     Shirlee Latch, MD  [facility] [phone] (phone) [phone] (fax)  [facility]

## 2023-02-08 ENCOUNTER — Ambulatory Visit (INDEPENDENT_AMBULATORY_CARE_PROVIDER_SITE_OTHER): Payer: Medicare HMO | Admitting: Family Medicine

## 2023-02-08 ENCOUNTER — Encounter: Payer: Self-pay | Admitting: Family Medicine

## 2023-02-08 VITALS — BP 132/80 | HR 70 | Temp 99.1°F | Resp 16 | Ht 63.0 in | Wt 152.6 lb

## 2023-02-08 DIAGNOSIS — Z780 Asymptomatic menopausal state: Secondary | ICD-10-CM | POA: Diagnosis not present

## 2023-02-08 DIAGNOSIS — Z79899 Other long term (current) drug therapy: Secondary | ICD-10-CM | POA: Diagnosis not present

## 2023-02-08 DIAGNOSIS — G4733 Obstructive sleep apnea (adult) (pediatric): Secondary | ICD-10-CM | POA: Diagnosis not present

## 2023-02-08 DIAGNOSIS — Z0000 Encounter for general adult medical examination without abnormal findings: Secondary | ICD-10-CM

## 2023-02-08 DIAGNOSIS — I10 Essential (primary) hypertension: Secondary | ICD-10-CM | POA: Diagnosis not present

## 2023-02-08 DIAGNOSIS — Z23 Encounter for immunization: Secondary | ICD-10-CM | POA: Diagnosis not present

## 2023-02-08 DIAGNOSIS — K219 Gastro-esophageal reflux disease without esophagitis: Secondary | ICD-10-CM | POA: Diagnosis not present

## 2023-02-08 DIAGNOSIS — E7800 Pure hypercholesterolemia, unspecified: Secondary | ICD-10-CM

## 2023-02-08 DIAGNOSIS — Z1231 Encounter for screening mammogram for malignant neoplasm of breast: Secondary | ICD-10-CM | POA: Diagnosis not present

## 2023-02-08 DIAGNOSIS — Z1211 Encounter for screening for malignant neoplasm of colon: Secondary | ICD-10-CM

## 2023-02-08 MED ORDER — OMEPRAZOLE 20 MG PO CPDR: 20.0000 mg | DELAYED_RELEASE_CAPSULE | Freq: Every day | ORAL | 3 refills | Status: AC

## 2023-02-08 MED ORDER — ROSUVASTATIN CALCIUM 5 MG PO TABS: 5.0000 mg | ORAL_TABLET | ORAL | 3 refills | Status: AC

## 2023-02-08 MED ORDER — IRBESARTAN-HYDROCHLOROTHIAZIDE 300-12.5 MG PO TABS: 1.0000 | ORAL_TABLET | Freq: Every day | ORAL | 3 refills | Status: DC

## 2023-02-08 NOTE — Assessment & Plan Note (Signed)
Inquires about replacing 61-65 year old CPAP machine. Discussed possibility of insurance coverage and benefits of an auto-adjusting CPAP machine. Explained that insurance may cover new machine and that new filters and tubes should be provided regularly. - Submit request for new CPAP machine with auto-adjusting feature - Ensure they receive new filters and tubes

## 2023-02-08 NOTE — Assessment & Plan Note (Signed)
On irbesartan/HCTZ 300/12.5 mg as needed, recently used due to stress from retirement. Blood pressure currently well-controlled. Prefers to refill medication for occasional use. - Refill irbesartan/HCTZ 300/12.5 mg

## 2023-02-08 NOTE — Assessment & Plan Note (Signed)
Reports leg cramps associated with atorvastatin 10 mg every other day. Discussed potential switch to rosuvastatin 5 mg every other day to minimize side effects. Explained that rosuvastatin and atorvastatin are both statins but processed differently in the body. Discussed CoQ10 supplementation to mitigate muscle cramps caused by statin-induced CoQ10 depletion. - Switch atorvastatin to rosuvastatin 5 mg every other day - Recommend CoQ10 supplementation

## 2023-02-08 NOTE — Assessment & Plan Note (Signed)
Reports increased acid reflux symptoms following suspected food poisoning incident. Currently on omeprazole 20 mg. Discussed that blood counts can help rule out anemia or bleeding, which are markers for ulcers. Explained that H. pylori testing is inaccurate while on omeprazole. - Order blood counts to check for anemia or bleeding - Continue omeprazole 20 mg

## 2023-02-09 LAB — LIPID PANEL
Chol/HDL Ratio: 2.7 {ratio} (ref 0.0–4.4)
Cholesterol, Total: 203 mg/dL — ABNORMAL HIGH (ref 100–199)
HDL: 75 mg/dL (ref >39)
LDL Chol Calc (NIH): 103 mg/dL — ABNORMAL HIGH (ref 0–99)
Triglycerides: 144 mg/dL (ref 0–149)
VLDL Cholesterol Cal: 25 mg/dL (ref 5–40)

## 2023-02-09 LAB — CBC WITH DIFFERENTIAL/PLATELET
Basophils Absolute: 0.1 10*3/uL (ref 0.0–0.2)
Basos: 1 %
EOS (ABSOLUTE): 0.2 10*3/uL (ref 0.0–0.4)
Eos: 3 %
Hematocrit: 48.9 % — ABNORMAL HIGH (ref 34.0–46.6)
Hemoglobin: 16.1 g/dL — ABNORMAL HIGH (ref 11.1–15.9)
Immature Grans (Abs): 0 10*3/uL (ref 0.0–0.1)
Immature Granulocytes: 0 %
Lymphocytes Absolute: 1.2 10*3/uL (ref 0.7–3.1)
Lymphs: 20 %
MCH: 30.4 pg (ref 26.6–33.0)
MCHC: 32.9 g/dL (ref 31.5–35.7)
MCV: 92 fL (ref 79–97)
Monocytes Absolute: 0.5 10*3/uL (ref 0.1–0.9)
Monocytes: 7 %
Neutrophils Absolute: 4.3 10*3/uL (ref 1.4–7.0)
Neutrophils: 69 %
Platelets: 276 10*3/uL (ref 150–450)
RBC: 5.3 x10E6/uL — ABNORMAL HIGH (ref 3.77–5.28)
RDW: 13 % (ref 11.7–15.4)
WBC: 6.3 10*3/uL (ref 3.4–10.8)

## 2023-02-09 LAB — COMPREHENSIVE METABOLIC PANEL
ALT: 43 [IU]/L — ABNORMAL HIGH (ref 0–32)
AST: 25 [IU]/L (ref 0–40)
Albumin: 4.7 g/dL (ref 3.9–4.9)
Alkaline Phosphatase: 83 [IU]/L (ref 44–121)
BUN/Creatinine Ratio: 13 (ref 12–28)
BUN: 11 mg/dL (ref 8–27)
Bilirubin Total: 0.6 mg/dL (ref 0.0–1.2)
CO2: 28 mmol/L (ref 20–29)
Calcium: 9.7 mg/dL (ref 8.7–10.3)
Chloride: 97 mmol/L (ref 96–106)
Creatinine, Ser: 0.84 mg/dL (ref 0.57–1.00)
Globulin, Total: 2.3 g/dL (ref 1.5–4.5)
Glucose: 93 mg/dL (ref 70–99)
Potassium: 3.9 mmol/L (ref 3.5–5.2)
Sodium: 142 mmol/L (ref 134–144)
Total Protein: 7 g/dL (ref 6.0–8.5)
eGFR: 77 mL/min/{1.73_m2} (ref >59)

## 2023-02-11 ENCOUNTER — Encounter: Payer: Self-pay | Admitting: Family Medicine

## 2023-03-26 ENCOUNTER — Other Ambulatory Visit: Payer: Self-pay

## 2023-03-26 DIAGNOSIS — D582 Other hemoglobinopathies: Secondary | ICD-10-CM | POA: Diagnosis not present

## 2023-03-27 DIAGNOSIS — Z1211 Encounter for screening for malignant neoplasm of colon: Secondary | ICD-10-CM | POA: Diagnosis not present

## 2023-03-27 LAB — COMPREHENSIVE METABOLIC PANEL
ALT: 32 [IU]/L (ref 0–32)
AST: 24 [IU]/L (ref 0–40)
Albumin: 4.5 g/dL (ref 3.9–4.9)
Alkaline Phosphatase: 73 [IU]/L (ref 44–121)
BUN/Creatinine Ratio: 8 — ABNORMAL LOW (ref 12–28)
BUN: 7 mg/dL — ABNORMAL LOW (ref 8–27)
Bilirubin Total: 0.6 mg/dL (ref 0.0–1.2)
CO2: 24 mmol/L (ref 20–29)
Calcium: 9.4 mg/dL (ref 8.7–10.3)
Chloride: 99 mmol/L (ref 96–106)
Creatinine, Ser: 0.92 mg/dL (ref 0.57–1.00)
Globulin, Total: 2 g/dL (ref 1.5–4.5)
Glucose: 85 mg/dL (ref 70–99)
Potassium: 4 mmol/L (ref 3.5–5.2)
Sodium: 140 mmol/L (ref 134–144)
Total Protein: 6.5 g/dL (ref 6.0–8.5)
eGFR: 69 mL/min/{1.73_m2} (ref >59)

## 2023-03-27 LAB — CBC WITH DIFFERENTIAL/PLATELET
Basophils Absolute: 0.1 10*3/uL (ref 0.0–0.2)
Basos: 1 %
EOS (ABSOLUTE): 0.2 10*3/uL (ref 0.0–0.4)
Eos: 3 %
Hematocrit: 47 % — ABNORMAL HIGH (ref 34.0–46.6)
Hemoglobin: 15.6 g/dL (ref 11.1–15.9)
Immature Grans (Abs): 0 10*3/uL (ref 0.0–0.1)
Immature Granulocytes: 0 %
Lymphocytes Absolute: 1.5 10*3/uL (ref 0.7–3.1)
Lymphs: 23 %
MCH: 30.3 pg (ref 26.6–33.0)
MCHC: 33.2 g/dL (ref 31.5–35.7)
MCV: 91 fL (ref 79–97)
Monocytes Absolute: 0.4 10*3/uL (ref 0.1–0.9)
Monocytes: 7 %
Neutrophils Absolute: 4.3 10*3/uL (ref 1.4–7.0)
Neutrophils: 66 %
Platelets: 242 10*3/uL (ref 150–450)
RBC: 5.15 x10E6/uL (ref 3.77–5.28)
RDW: 12.9 % (ref 11.7–15.4)
WBC: 6.5 10*3/uL (ref 3.4–10.8)

## 2023-03-29 ENCOUNTER — Encounter: Payer: Self-pay | Admitting: Family Medicine

## 2023-04-03 ENCOUNTER — Other Ambulatory Visit: Payer: Self-pay | Admitting: Family Medicine

## 2023-04-05 NOTE — Telephone Encounter (Signed)
 Medication no longer listed on current medication list Requested Prescriptions  Pending Prescriptions Disp Refills   atorvastatin  (LIPITOR) 10 MG tablet [Pharmacy Med Name: ATORVASTATIN  10 MG TABLET] 45 tablet 3    Sig: TAKE ONE TABLET EVERY OTHER NIGHT     Cardiovascular:  Antilipid - Statins Failed - 04/05/2023 11:49 AM      Failed - Lipid Panel in normal range within the last 12 months    Cholesterol, Total  Date Value Ref Range Status  02/08/2023 203 (H) 100 - 199 mg/dL Final   LDL Chol Calc (NIH)  Date Value Ref Range Status  02/08/2023 103 (H) 0 - 99 mg/dL Final   HDL  Date Value Ref Range Status  02/08/2023 75 >39 mg/dL Final   Triglycerides  Date Value Ref Range Status  02/08/2023 144 0 - 149 mg/dL Final         Passed - Patient is not pregnant      Passed - Valid encounter within last 12 months    Recent Outpatient Visits           1 month ago Welcome to [employer] preventive visit   [facility] [place], Stan Eans, [place]   1 year ago Essential hypertension   [place] [facility] [place], Stan Eans, MD   2 years ago Palpitations   [place] [facility] [place], Stan Eans, MD   2 years ago Essential hypertension   [place] [facility] [place], Stan Eans, MD   2 years ago Rash   [facility] Gordon [place], [place]       Future Appointments             In 4 months Bacigalupo, Stan Eans, MD [facility]

## 2023-04-06 LAB — COLOGUARD: COLOGUARD: NEGATIVE

## 2023-04-08 ENCOUNTER — Encounter: Payer: Self-pay | Admitting: Family Medicine

## 2023-04-22 ENCOUNTER — Encounter: Payer: Self-pay | Admitting: Family Medicine

## 2023-04-22 DIAGNOSIS — R718 Other abnormality of red blood cells: Secondary | ICD-10-CM

## 2023-04-23 DIAGNOSIS — G4733 Obstructive sleep apnea (adult) (pediatric): Secondary | ICD-10-CM | POA: Diagnosis not present

## 2023-04-23 NOTE — Telephone Encounter (Signed)
 Get CBC and iron panel in 3 weeks (want her to be 6 weeks from her donation).

## 2023-04-29 DIAGNOSIS — H52223 Regular astigmatism, bilateral: Secondary | ICD-10-CM | POA: Diagnosis not present

## 2023-04-29 DIAGNOSIS — H524 Presbyopia: Secondary | ICD-10-CM | POA: Diagnosis not present

## 2023-04-29 DIAGNOSIS — H2513 Age-related nuclear cataract, bilateral: Secondary | ICD-10-CM | POA: Diagnosis not present

## 2023-04-29 DIAGNOSIS — H5203 Hypermetropia, bilateral: Secondary | ICD-10-CM | POA: Diagnosis not present

## 2023-04-29 DIAGNOSIS — Z0100 Encounter for examination of eyes and vision without abnormal findings: Secondary | ICD-10-CM | POA: Diagnosis not present

## 2023-04-29 DIAGNOSIS — H35363 Drusen (degenerative) of macula, bilateral: Secondary | ICD-10-CM | POA: Diagnosis not present

## 2023-04-29 NOTE — Telephone Encounter (Signed)
 Elevated hematocrit

## 2023-05-06 DIAGNOSIS — Z91040 Latex allergy status: Secondary | ICD-10-CM | POA: Diagnosis not present

## 2023-05-06 DIAGNOSIS — N182 Chronic kidney disease, stage 2 (mild): Secondary | ICD-10-CM | POA: Diagnosis not present

## 2023-05-06 DIAGNOSIS — G4733 Obstructive sleep apnea (adult) (pediatric): Secondary | ICD-10-CM | POA: Diagnosis not present

## 2023-05-06 DIAGNOSIS — Z8249 Family history of ischemic heart disease and other diseases of the circulatory system: Secondary | ICD-10-CM | POA: Diagnosis not present

## 2023-05-06 DIAGNOSIS — I129 Hypertensive chronic kidney disease with stage 1 through stage 4 chronic kidney disease, or unspecified chronic kidney disease: Secondary | ICD-10-CM | POA: Diagnosis not present

## 2023-05-06 DIAGNOSIS — Z9181 History of falling: Secondary | ICD-10-CM | POA: Diagnosis not present

## 2023-05-06 DIAGNOSIS — E785 Hyperlipidemia, unspecified: Secondary | ICD-10-CM | POA: Diagnosis not present

## 2023-05-06 DIAGNOSIS — K219 Gastro-esophageal reflux disease without esophagitis: Secondary | ICD-10-CM | POA: Diagnosis not present

## 2023-05-21 DIAGNOSIS — G4733 Obstructive sleep apnea (adult) (pediatric): Secondary | ICD-10-CM | POA: Diagnosis not present

## 2023-05-27 DIAGNOSIS — R718 Other abnormality of red blood cells: Secondary | ICD-10-CM | POA: Diagnosis not present

## 2023-05-28 ENCOUNTER — Encounter: Payer: Self-pay | Admitting: Family Medicine

## 2023-05-28 LAB — CBC WITH DIFFERENTIAL/PLATELET
Basophils Absolute: 0.1 10*3/uL (ref 0.0–0.2)
Basos: 1 %
EOS (ABSOLUTE): 0.2 10*3/uL (ref 0.0–0.4)
Eos: 2 %
Hematocrit: 45.7 % (ref 34.0–46.6)
Hemoglobin: 14.9 g/dL (ref 11.1–15.9)
Immature Grans (Abs): 0 10*3/uL (ref 0.0–0.1)
Immature Granulocytes: 0 %
Lymphocytes Absolute: 1.7 10*3/uL (ref 0.7–3.1)
Lymphs: 22 %
MCH: 29.6 pg (ref 26.6–33.0)
MCHC: 32.6 g/dL (ref 31.5–35.7)
MCV: 91 fL (ref 79–97)
Monocytes Absolute: 0.5 10*3/uL (ref 0.1–0.9)
Monocytes: 7 %
Neutrophils Absolute: 5.3 10*3/uL (ref 1.4–7.0)
Neutrophils: 68 %
Platelets: 268 10*3/uL (ref 150–450)
RBC: 5.04 x10E6/uL (ref 3.77–5.28)
RDW: 12.6 % (ref 11.7–15.4)
WBC: 7.8 10*3/uL (ref 3.4–10.8)

## 2023-05-28 LAB — IRON,TIBC AND FERRITIN PANEL
Ferritin: 29 ng/mL (ref 15–150)
Iron Saturation: 20 % (ref 15–55)
Iron: 72 ug/dL (ref 27–139)
Total Iron Binding Capacity: 362 ug/dL (ref 250–450)
UIBC: 290 ug/dL (ref 118–369)

## 2023-06-21 DIAGNOSIS — G4733 Obstructive sleep apnea (adult) (pediatric): Secondary | ICD-10-CM | POA: Diagnosis not present

## 2023-07-15 ENCOUNTER — Other Ambulatory Visit

## 2023-07-15 ENCOUNTER — Ambulatory Visit
Admission: RE | Admit: 2023-07-15 | Discharge: 2023-07-15 | Disposition: A | Discharge status: Home / Self Care | Source: Ambulatory Visit | Attending: Family Medicine | Admitting: Family Medicine

## 2023-07-15 DIAGNOSIS — Z1231 Encounter for screening mammogram for malignant neoplasm of breast: Secondary | ICD-10-CM | POA: Insufficient documentation

## 2023-07-21 ENCOUNTER — Other Ambulatory Visit: Payer: Self-pay | Admitting: Family Medicine

## 2023-07-21 DIAGNOSIS — R928 Other abnormal and inconclusive findings on diagnostic imaging of breast: Secondary | ICD-10-CM

## 2023-07-21 DIAGNOSIS — G4733 Obstructive sleep apnea (adult) (pediatric): Secondary | ICD-10-CM | POA: Diagnosis not present

## 2023-07-22 ENCOUNTER — Encounter: Payer: Self-pay | Admitting: Family Medicine

## 2023-07-22 DIAGNOSIS — G4733 Obstructive sleep apnea (adult) (pediatric): Secondary | ICD-10-CM | POA: Diagnosis not present

## 2023-07-22 NOTE — Telephone Encounter (Signed)
Please see the pt message below

## 2023-08-03 ENCOUNTER — Ambulatory Visit: Payer: Self-pay

## 2023-08-03 ENCOUNTER — Encounter: Payer: Self-pay | Admitting: Family Medicine

## 2023-08-03 ENCOUNTER — Ambulatory Visit: Admitting: Family Medicine

## 2023-08-03 VITALS — BP 96/72 | HR 87 | Wt 150.8 lb

## 2023-08-03 DIAGNOSIS — S70262A Insect bite (nonvenomous), left hip, initial encounter: Secondary | ICD-10-CM

## 2023-08-03 DIAGNOSIS — R5383 Other fatigue: Secondary | ICD-10-CM

## 2023-08-03 DIAGNOSIS — W57XXXA Bitten or stung by nonvenomous insect and other nonvenomous arthropods, initial encounter: Secondary | ICD-10-CM

## 2023-08-03 MED ORDER — DOXYCYCLINE HYCLATE 100 MG PO TABS: 100.0000 mg | ORAL_TABLET | Freq: Two times a day (BID) | ORAL | 0 refills | Status: DC

## 2023-08-03 NOTE — Telephone Encounter (Signed)
 FYI Only or Action Required?: FYI only for provider  Patient was last seen in primary care on 02/08/2023 by Mazie Speed, MD. Called Nurse Triage reporting Fever and Tick Removal. Symptoms began several days ago. Interventions attempted: OTC medications: tylenol, and emergency. Symptoms are: unchanged.  Triage Disposition: Call PCP Within 24 Hours  Patient/caregiver understands and will follow disposition?: Yes     Copied from CRM [id number]. Topic: Clinical - Red Word Triage >> Aug 03, 2023  9:29 AM Marissa P wrote: Red Word that prompted transfer to Nurse Triage: Patient has a temperature of 101.6, lethargic, achy, no appetite and its for a couple weeks now. Would like to be seen as soon as possible please Reason for Disposition  [1] Probable deer tick AND [2] attached 36 hours or more (or tick appears swollen, not flat) AND [3] occurred in an area where Lyme disease is common  Answer Assessment - Initial Assessment Questions 1. ATTACHED:  "Is the tick still on the skin?"  (e.g., yes, no, unsure)     no  3. ONSET - TICK NOT STILL ATTACHED: "If the tick has been removed, how long do you think the tick was attached before you removed it?" (e.g., 5 hours, 2 days). "When was this?"     Saturday 4. LOCATION: "Where is the tick bite located?" (e.g., arm, leg)     Left leg by hip/thigh 5. TYPE of TICK: "Is it a wood tick or a deer tick?" (e.g., deer tick, wood tick; unsure)     unsure 6. SIZE of TICK: "How big is the tick?" (e.g., size of poppy seed, apple seed, watermelon seed; unsure) Note: Deer ticks can be the size of a poppy seed (nymph) or an apple seed (adult).       Apple seed-watermelon seed 7. ENGORGED: "Did the tick look flat or engorged (full, swollen)?" (e.g., flat, engorged; unsure)     engorged 8. OTHER SYMPTOMS: "Do you have any other symptoms?" (e.g., fever, rash, redness at bite area, red ring around bite)     Red, fatigue, fever of 101.6, no appetite.  Protocols  used: Tick Bite-A-AH

## 2023-08-03 NOTE — Telephone Encounter (Signed)
FYI please see the message below.

## 2023-08-03 NOTE — Progress Notes (Signed)
 Established patient visit   Patient: Heather Russell   DOB: Aug 31, 1955   68 y.o. Female  MRN: [id number] Visit Date: 08/03/2023  Today's healthcare provider: Carlean Charter, DO   Chief Complaint  Patient presents with   Fever    fever this morning of 101.6 took tylenol  Noticed tick bite on Saturday     Fatigue   Generalized Body Aches    aches ongoing for weeks   Subjective    HPI [name] is a 68 year old female who presents with body aches and fever following a tick bite.  She discovered a tick on her body on Saturday while in [place]  for a wedding. She removed it by pressing on it until it fell off. She is unsure how long the tick was attached but noted it was large enough to squish. No rash was noted at the site of the bite or elsewhere on her body.  She has been experiencing body aches for approximately three to four weeks, described as 'all over', and has been feeling very lethargic. She attributes some of the aches to physical activity related to babysitting her grandchild and doing housework. The fatigue has been particularly pronounced over the past week, causing difficulty in performing daily activities, such as blow drying her hair, and leading to unexpected napping.  She experienced a fever this morning and took three acetaminophen tablets. She has had a sore throat and mild congestion, which she attributes to allergies, and has been taking allergy medication. No significant sneezing or nasal congestion.  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       Medications: Outpatient Medications Prior to Visit  Medication Sig   irbesartan -hydrochlorothiazide  (AVALIDE) 300-12.5 MG tablet Take 1 tablet by mouth daily.   Multiple Vitamin (MULTI-VITAMINS) TABS Take 1 tablet by mouth daily.   omeprazole  (PRILOSEC) 20 MG capsule Take 1 capsule (20 mg total) by mouth daily.   rosuvastatin  (CRESTOR ) 5 MG tablet  Take 1 tablet (5 mg total) by mouth every other day.   No facility-administered medications prior to visit.    Review of Systems  Constitutional:  Positive for fatigue and fever. Negative for chills.  HENT:  Positive for postnasal drip and sore throat.   Respiratory:  Negative for cough.   Musculoskeletal:  Positive for myalgias.  Skin:  Negative for rash.        Objective    BP 96/72 (BP Location: Right Arm, Patient Position: Sitting, Cuff Size: Normal)   Pulse 87   Wt 150 lb 12.8 oz (68.4 kg)   SpO2 97%   BMI 26.71 kg/m     Physical Exam Vitals and nursing note reviewed.  Constitutional:      General: She is not in acute distress.    Appearance: Normal appearance.  HENT:     Head: Normocephalic and atraumatic.  Eyes:     General: No scleral icterus.    Conjunctiva/sclera: Conjunctivae normal.  Cardiovascular:     Rate and Rhythm: Normal rate.  Pulmonary:     Effort: Pulmonary effort is normal.  Skin:    Comments: Approx. 3mm circular red lesion at location of tick bite to left lateral upper thigh  Neurological:     Mental Status: She is alert and oriented to person, place, and time. Mental status is at baseline.  Psychiatric:        Mood and Affect: Mood normal.  Behavior: Behavior normal.      No results found for any visits on 08/03/23.  Assessment & Plan    Tick bite of left hip, initial encounter -     Doxycycline Hyclate; Take 1 tablet (100 mg total) by mouth 2 (two) times daily for 10 days.  Dispense: 20 tablet; Refill: 0 -     CBC with Differential/Platelet  Other fatigue -     CBC with Differential/Platelet -     Epstein-Barr virus nuclear antigen antibody, IgG     Tick-borne illness; myalgia; fever Symptoms suggest tick-borne illness, possibly Lyme disease. Doxycycline chosen for broad-spectrum efficacy close coverage of differentials including [facility] spotted fever and ehrlichiosis. - Prescribe doxycycline for 10 days. -  Advise probiotics or [place] yogurt, separated from doxycycline doses by several hours. - Order CBC for leukocytosis.  Fatigue Fatigue likely related to tick-borne illness or other infection. Unlikely influenza given minimal of respiratory symptoms and prolonged course of myalgia. - Order CBC for infection or anemia. - Order Epstein-Barr virus test.  General Health Maintenance Discussed non-transmissible nature of tick-borne illnesses. - Reassure about non-transmissible nature of tick-borne illnesses.   No follow-ups on file.      I discussed the assessment and treatment plan with the patient  The patient was provided an opportunity to ask questions and all were answered. The patient agreed with the plan and demonstrated an understanding of the instructions.   The patient was advised to call back or seek an in-person evaluation if the symptoms worsen or if the condition fails to improve as anticipated.    Carlean Charter, DO  [facility] [phone] (phone) [phone] (fax)  [facility]

## 2023-08-05 ENCOUNTER — Ambulatory Visit
Admission: RE | Admit: 2023-08-05 | Discharge: 2023-08-05 | Disposition: A | Discharge status: Home / Self Care | Source: Ambulatory Visit | Attending: Family Medicine | Admitting: Family Medicine

## 2023-08-05 ENCOUNTER — Telehealth: Payer: Self-pay

## 2023-08-05 ENCOUNTER — Encounter: Payer: Self-pay | Admitting: Family Medicine

## 2023-08-05 ENCOUNTER — Ambulatory Visit: Payer: Self-pay | Admitting: Family Medicine

## 2023-08-05 DIAGNOSIS — R928 Other abnormal and inconclusive findings on diagnostic imaging of breast: Secondary | ICD-10-CM

## 2023-08-05 DIAGNOSIS — R92321 Mammographic fibroglandular density, right breast: Secondary | ICD-10-CM | POA: Diagnosis not present

## 2023-08-05 DIAGNOSIS — R5383 Other fatigue: Secondary | ICD-10-CM

## 2023-08-05 LAB — CBC WITH DIFFERENTIAL/PLATELET
Basophils Absolute: 0.1 10*3/uL (ref 0.0–0.2)
Basos: 1 %
EOS (ABSOLUTE): 0.1 10*3/uL (ref 0.0–0.4)
Eos: 2 %
Hematocrit: 42.2 % (ref 34.0–46.6)
Hemoglobin: 13.4 g/dL (ref 11.1–15.9)
Immature Grans (Abs): 0 10*3/uL (ref 0.0–0.1)
Immature Granulocytes: 0 %
Lymphocytes Absolute: 1.5 10*3/uL (ref 0.7–3.1)
Lymphs: 36 %
MCH: 28 pg (ref 26.6–33.0)
MCHC: 31.8 g/dL (ref 31.5–35.7)
MCV: 88 fL (ref 79–97)
Monocytes Absolute: 0.3 10*3/uL (ref 0.1–0.9)
Monocytes: 7 %
Neutrophils Absolute: 2.1 10*3/uL (ref 1.4–7.0)
Neutrophils: 54 %
Platelets: 187 10*3/uL (ref 150–450)
RBC: 4.79 x10E6/uL (ref 3.77–5.28)
RDW: 12.8 % (ref 11.7–15.4)
WBC: 4 10*3/uL (ref 3.4–10.8)

## 2023-08-05 LAB — EPSTEIN-BARR VIRUS NUCLEAR ANTIGEN ANTIBODY, IGG: EBV NA IgG: 232 U/mL — ABNORMAL HIGH (ref 0.0–17.9)

## 2023-08-05 NOTE — Telephone Encounter (Signed)
 Will send to ordering provider.

## 2023-08-05 NOTE — Telephone Encounter (Signed)
 Copied from CRM [id number]. Topic: Clinical - Lab/Test Results >> Aug 05, 2023  1:41 PM Ivette P wrote: Reason for CRM: PT called in about a Epstein-Barr test performed on 08/03/2023.   Pt would really like to know results today 06/12, due to having to babysit tomorrow and would need to make accommodations if testing came out positive.   Pt has sent myChart message as well.   Please reach out to pt as soon as possible [phone]

## 2023-08-06 ENCOUNTER — Telehealth: Payer: Self-pay

## 2023-08-06 ENCOUNTER — Encounter: Payer: Self-pay | Admitting: Family Medicine

## 2023-08-06 NOTE — Telephone Encounter (Signed)
 Copied from CRM [id number]. Topic: Appointments - Transfer of Care >> Aug 06, 2023  9:53 AM Chuck Crater wrote: Pt is requesting to transfer FROM: Dr. Aden Agreste Pt is requesting to transfer TO: Kaleen Ore Reason for requested transfer: is not pleased with service It is the responsibility of the team the patient would like to transfer to (Dr. David Escort) to reach out to the patient if for any reason this transfer is not acceptable.

## 2023-08-06 NOTE — Telephone Encounter (Signed)
 Noted

## 2023-08-09 ENCOUNTER — Other Ambulatory Visit: Payer: Self-pay | Admitting: Family Medicine

## 2023-08-09 DIAGNOSIS — R5383 Other fatigue: Secondary | ICD-10-CM | POA: Diagnosis not present

## 2023-08-09 NOTE — Addendum Note (Signed)
 Addended by: Judyann Number on: 08/09/2023 12:14 PM   Modules accepted: Orders

## 2023-08-09 NOTE — Telephone Encounter (Signed)
 Dr Athena Bland planning on connecting with the patient today.

## 2023-08-09 NOTE — Telephone Encounter (Signed)
 Called and spoke with patient regarding results and discussed that the test ordered unfortunately did not reflex an IgM, so we will need to check that test to know if this is an acute infection versus an old infection.  Did discuss with her that, because her complete blood count looks normal, it is much less likely to be an acute infection.  Will also check for CMV as possible source of her symptoms.

## 2023-08-10 ENCOUNTER — Ambulatory Visit: Payer: Self-pay | Admitting: Family Medicine

## 2023-08-10 DIAGNOSIS — B259 Cytomegaloviral disease, unspecified: Secondary | ICD-10-CM | POA: Insufficient documentation

## 2023-08-11 LAB — EPSTEIN-BARR VIRUS VCA, IGM

## 2023-08-11 LAB — CMV IGM: CMV IgM Ser EIA-aCnc: 180 [AU]/ml — ABNORMAL HIGH (ref 0.0–29.9)

## 2023-08-12 ENCOUNTER — Encounter: Payer: Self-pay | Admitting: Family Medicine

## 2023-08-12 ENCOUNTER — Ambulatory Visit (INDEPENDENT_AMBULATORY_CARE_PROVIDER_SITE_OTHER): Payer: Self-pay | Admitting: Family Medicine

## 2023-08-12 VITALS — BP 106/74 | HR 91 | Ht 63.0 in | Wt 148.5 lb

## 2023-08-12 DIAGNOSIS — E7800 Pure hypercholesterolemia, unspecified: Secondary | ICD-10-CM | POA: Diagnosis not present

## 2023-08-12 DIAGNOSIS — I10 Essential (primary) hypertension: Secondary | ICD-10-CM

## 2023-08-12 DIAGNOSIS — K219 Gastro-esophageal reflux disease without esophagitis: Secondary | ICD-10-CM

## 2023-08-12 DIAGNOSIS — B259 Cytomegaloviral disease, unspecified: Secondary | ICD-10-CM

## 2023-08-12 DIAGNOSIS — G4733 Obstructive sleep apnea (adult) (pediatric): Secondary | ICD-10-CM | POA: Diagnosis not present

## 2023-08-12 NOTE — Assessment & Plan Note (Signed)
 Recent CMV infection confirmed by positive IgM test. Symptoms include fatigue, malaise, and myalgia. No treatment required as the body will naturally resolve the infection (immunocompetent). She is no longer contagious. - Provide supportive care as needed

## 2023-08-12 NOTE — Telephone Encounter (Signed)
 Pt advised while in office. Had further questions that will be discussed with PCP during visit

## 2023-08-12 NOTE — Assessment & Plan Note (Signed)
 OSA managed with CPAP therapy. She reports good compliance and effectiveness with the new CPAP machine.

## 2023-08-12 NOTE — Assessment & Plan Note (Signed)
 GERD managed with omeprazole  20 mg daily.

## 2023-08-12 NOTE — Progress Notes (Signed)
 Established patient visit   Patient: Heather Russell   DOB: [birth year]   68 y.o. Female  MRN: [id number] Visit Date: 08/12/2023  Today's healthcare provider: Aden Agreste, MD   Chief Complaint  Patient presents with   Medical Management of Chronic Issues    Patient reports taking medications as prescribed and tolerating well with no side effects   Hyperlipidemia   Hypertension   Results    Discuss recent lab results    Subjective    Hyperlipidemia  Hypertension   HPI     Medical Management of Chronic Issues    Additional comments: Patient reports taking medications as prescribed and tolerating well with no side effects        Results    Additional comments: Discuss recent lab results       Last edited by Pasty Bongo, CMA on 08/12/2023 10:00 AM.       Discussed the use of AI scribe software for clinical note transcription with the patient, who gave verbal consent to proceed.  History of Present Illness   Heather Russell is a 68 year old female with hypertension, hyperlipidemia, and obstructive sleep apnea who presents with fatigue and malaise.  She experiences fatigue and malaise, feeling generally rundown. A recent tick bite occurred, but symptoms began prior, with no bullseye rash observed. Muscle exhaustion is present, initially thought to be due to aging and activity. After traveling to [place] , she felt extremely fatigued and slept immediately upon returning home. She uses over-the-counter immune boosters and Tylenol.  Testing for EBV and CMV shows positive EBV IgG and negative IgM, with positive CMV IgM. She is concerned about infecting her family, as she cares for her grandson with a double ear infection and her son-in-law is unwell. Symptoms began with muscle fatigue and include a lack of appetite.  Her medical history includes hypertension, hyperlipidemia, OSA, and GERD. She takes Crestor , irbesartan -HCTZ, and omeprazole . Crestor   causes leg discomfort, managed by daytime dosing. She uses a CPAP machine effectively for OSA.  She was prescribed doxycycline  for a suspected tick-borne illness but stopped after one dose due to gastrointestinal discomfort, determining symptoms were unrelated to a tick-borne illness.         Medications: Outpatient Medications Prior to Visit  Medication Sig   irbesartan -hydrochlorothiazide  (AVALIDE) 300-12.5 MG tablet Take 1 tablet by mouth daily.   Multiple Vitamin (MULTI-VITAMINS) TABS Take 1 tablet by mouth daily.   omeprazole  (PRILOSEC) 20 MG capsule Take 1 capsule (20 mg total) by mouth daily.   rosuvastatin  (CRESTOR ) 5 MG tablet Take 1 tablet (5 mg total) by mouth every other day.   No facility-administered medications prior to visit.    Review of Systems     Objective    BP 106/74 (BP Location: Left Arm, Patient Position: Sitting, Cuff Size: Normal)   Pulse 91   Ht 5' 3 (1.6 m)   Wt 148 lb 8 oz (67.4 kg)   BMI 26.31 kg/m    Physical Exam Vitals reviewed.  Constitutional:      General: She is not in acute distress.    Appearance: Normal appearance. She is well-developed. She is not diaphoretic.  HENT:     Head: Normocephalic and atraumatic.   Eyes:     General: No scleral icterus.    Conjunctiva/sclera: Conjunctivae normal.   Neck:     Thyroid : No thyromegaly.   Cardiovascular:     Rate and Rhythm: Normal rate and regular rhythm.  Heart sounds: Normal heart sounds. No murmur heard. Pulmonary:     Effort: Pulmonary effort is normal. No respiratory distress.     Breath sounds: Normal breath sounds. No wheezing, rhonchi or rales.   Musculoskeletal:     Cervical back: Neck supple.     Right lower leg: No edema.     Left lower leg: No edema.  Lymphadenopathy:     Cervical: No cervical adenopathy.   Skin:    General: Skin is warm and dry.     Findings: No rash.   Neurological:     Mental Status: She is alert and oriented to person, place, and  time. Mental status is at baseline.   Psychiatric:        Mood and Affect: Mood normal.        Behavior: Behavior normal.      No results found for any visits on 08/12/23.  Assessment & Plan     Problem List Items Addressed This Visit       Cardiovascular and Mediastinum   Essential hypertension - Primary   Blood pressure is well-controlled with current medication regimen. Recent weight loss may contribute to improved blood pressure control.      Relevant Orders   Comprehensive metabolic panel with GFR     Respiratory   OSA on CPAP   OSA managed with CPAP therapy. She reports good compliance and effectiveness with the new CPAP machine.        Digestive   GERD (gastroesophageal reflux disease)   GERD managed with omeprazole  20 mg daily.         Other   Hypercholesteremia   Cholesterol levels slightly increased from previous year. Currently taking Crestor  5 mg every other day. Reports myalgia possibly related to statin use. Considering CoQ10 supplementation to alleviate muscle symptoms. - Recommend CoQ10 supplementation - Order lipid panel      Relevant Orders   Comprehensive metabolic panel with GFR   Lipid panel   CMV (cytomegalovirus infection) status positive (HCC)   Recent CMV infection confirmed by positive IgM test. Symptoms include fatigue, malaise, and myalgia. No treatment required as the body will naturally resolve the infection (immunocompetent). She is no longer contagious. - Provide supportive care as needed        Return in about 6 months (around 02/11/2024) for CPE.       Aden Agreste, MD  [facility] [phone] (phone) [phone] (fax)  [facility]

## 2023-08-12 NOTE — Assessment & Plan Note (Signed)
 Blood pressure is well-controlled with current medication regimen. Recent weight loss may contribute to improved blood pressure control.

## 2023-08-12 NOTE — Assessment & Plan Note (Signed)
 Cholesterol levels slightly increased from previous year. Currently taking Crestor  5 mg every other day. Reports myalgia possibly related to statin use. Considering CoQ10 supplementation to alleviate muscle symptoms. - Recommend CoQ10 supplementation - Order lipid panel

## 2023-08-13 ENCOUNTER — Ambulatory Visit: Payer: Self-pay | Admitting: Family Medicine

## 2023-08-13 DIAGNOSIS — R7989 Other specified abnormal findings of blood chemistry: Secondary | ICD-10-CM

## 2023-08-13 DIAGNOSIS — E875 Hyperkalemia: Secondary | ICD-10-CM

## 2023-08-13 LAB — LIPID PANEL
Chol/HDL Ratio: 4.2 ratio (ref 0.0–4.4)
Cholesterol, Total: 157 mg/dL (ref 100–199)
HDL: 37 mg/dL — ABNORMAL LOW (ref >39)
LDL Chol Calc (NIH): 86 mg/dL (ref 0–99)
Triglycerides: 197 mg/dL — ABNORMAL HIGH (ref 0–149)
VLDL Cholesterol Cal: 34 mg/dL (ref 5–40)

## 2023-08-13 LAB — COMPREHENSIVE METABOLIC PANEL WITH GFR
ALT: 80 IU/L — ABNORMAL HIGH (ref 0–32)
AST: 49 IU/L — ABNORMAL HIGH (ref 0–40)
Albumin: 4 g/dL (ref 3.9–4.9)
Alkaline Phosphatase: 191 IU/L — ABNORMAL HIGH (ref 44–121)
BUN/Creatinine Ratio: 13 (ref 12–28)
BUN: 10 mg/dL (ref 8–27)
Bilirubin Total: 0.4 mg/dL (ref 0.0–1.2)
CO2: 24 mmol/L (ref 20–29)
Calcium: 9.2 mg/dL (ref 8.7–10.3)
Chloride: 97 mmol/L (ref 96–106)
Creatinine, Ser: 0.76 mg/dL (ref 0.57–1.00)
Globulin, Total: 2.4 g/dL (ref 1.5–4.5)
Glucose: 83 mg/dL (ref 70–99)
Potassium: 5.6 mmol/L — ABNORMAL HIGH (ref 3.5–5.2)
Sodium: 137 mmol/L (ref 134–144)
Total Protein: 6.4 g/dL (ref 6.0–8.5)
eGFR: 86 mL/min/{1.73_m2} (ref >59)

## 2023-08-21 DIAGNOSIS — G4733 Obstructive sleep apnea (adult) (pediatric): Secondary | ICD-10-CM | POA: Diagnosis not present

## 2023-08-26 DIAGNOSIS — E875 Hyperkalemia: Secondary | ICD-10-CM | POA: Diagnosis not present

## 2023-08-26 DIAGNOSIS — R7989 Other specified abnormal findings of blood chemistry: Secondary | ICD-10-CM | POA: Diagnosis not present

## 2023-08-27 LAB — COMPREHENSIVE METABOLIC PANEL WITH GFR
ALT: 33 IU/L — ABNORMAL HIGH (ref 0–32)
AST: 28 IU/L (ref 0–40)
Albumin: 3.9 g/dL (ref 3.9–4.9)
Alkaline Phosphatase: 92 IU/L (ref 44–121)
BUN/Creatinine Ratio: 10 — ABNORMAL LOW (ref 12–28)
BUN: 8 mg/dL (ref 8–27)
Bilirubin Total: 0.3 mg/dL (ref 0.0–1.2)
CO2: 25 mmol/L (ref 20–29)
Calcium: 9 mg/dL (ref 8.7–10.3)
Chloride: 99 mmol/L (ref 96–106)
Creatinine, Ser: 0.82 mg/dL (ref 0.57–1.00)
Globulin, Total: 2.1 g/dL (ref 1.5–4.5)
Glucose: 89 mg/dL (ref 70–99)
Potassium: 4.9 mmol/L (ref 3.5–5.2)
Sodium: 136 mmol/L (ref 134–144)
Total Protein: 6 g/dL (ref 6.0–8.5)
eGFR: 79 mL/min/1.73 (ref >59)

## 2023-09-20 DIAGNOSIS — G4733 Obstructive sleep apnea (adult) (pediatric): Secondary | ICD-10-CM | POA: Diagnosis not present

## 2023-10-21 DIAGNOSIS — G4733 Obstructive sleep apnea (adult) (pediatric): Secondary | ICD-10-CM | POA: Diagnosis not present

## 2023-12-10 ENCOUNTER — Encounter: Admitting: Nurse Practitioner

## 2024-01-06 DIAGNOSIS — H9042 Sensorineural hearing loss, unilateral, left ear, with unrestricted hearing on the contralateral side: Secondary | ICD-10-CM | POA: Diagnosis not present

## 2024-01-06 DIAGNOSIS — H6982 Other specified disorders of Eustachian tube, left ear: Secondary | ICD-10-CM | POA: Diagnosis not present

## 2024-01-06 DIAGNOSIS — J301 Allergic rhinitis due to pollen: Secondary | ICD-10-CM | POA: Diagnosis not present

## 2024-01-14 ENCOUNTER — Ambulatory Visit: Admitting: Nurse Practitioner

## 2024-01-17 DIAGNOSIS — M654 Radial styloid tenosynovitis [de Quervain]: Secondary | ICD-10-CM | POA: Diagnosis not present

## 2024-01-21 DIAGNOSIS — G4733 Obstructive sleep apnea (adult) (pediatric): Secondary | ICD-10-CM | POA: Diagnosis not present

## 2024-02-20 DIAGNOSIS — G4733 Obstructive sleep apnea (adult) (pediatric): Secondary | ICD-10-CM | POA: Diagnosis not present

## 2024-02-27 ENCOUNTER — Other Ambulatory Visit: Payer: Self-pay | Admitting: Family Medicine

## 2024-02-29 ENCOUNTER — Encounter: Admitting: Nurse Practitioner

## 2024-02-29 ENCOUNTER — Ambulatory Visit: Admitting: Nurse Practitioner
